# Patient Record
Sex: Female | Born: 1965 | Race: Black or African American | Hispanic: No | Marital: Single | Smoking: Never smoker
Health system: Southern US, Community
[De-identification: ages and names within clinical notes are randomized; demographics above are authoritative.]

## PROBLEM LIST (undated history)

## (undated) DIAGNOSIS — G4733 Obstructive sleep apnea (adult) (pediatric): Secondary | ICD-10-CM

## (undated) DIAGNOSIS — R12 Heartburn: Secondary | ICD-10-CM

## (undated) DIAGNOSIS — M76829 Posterior tibial tendinitis, unspecified leg: Secondary | ICD-10-CM

## (undated) DIAGNOSIS — E669 Obesity, unspecified: Secondary | ICD-10-CM

## (undated) DIAGNOSIS — D34 Benign neoplasm of thyroid gland: Secondary | ICD-10-CM

## (undated) DIAGNOSIS — M1990 Unspecified osteoarthritis, unspecified site: Secondary | ICD-10-CM

## (undated) DIAGNOSIS — Z833 Family history of diabetes mellitus: Secondary | ICD-10-CM

## (undated) DIAGNOSIS — F32A Depression, unspecified: Secondary | ICD-10-CM

## (undated) DIAGNOSIS — F329 Major depressive disorder, single episode, unspecified: Secondary | ICD-10-CM

## (undated) DIAGNOSIS — M79671 Pain in right foot: Secondary | ICD-10-CM

## (undated) DIAGNOSIS — K219 Gastro-esophageal reflux disease without esophagitis: Secondary | ICD-10-CM

## (undated) HISTORY — DX: Major depressive disorder, single episode, unspecified: F32.9

## (undated) HISTORY — DX: Posterior tibial tendinitis, unspecified leg: M76.829

## (undated) HISTORY — DX: Unspecified osteoarthritis, unspecified site: M19.90

## (undated) HISTORY — DX: Benign neoplasm of thyroid gland: D34

## (undated) HISTORY — PX: TONSILLECTOMY: SUR1361

## (undated) HISTORY — DX: Heartburn: R12

## (undated) HISTORY — DX: Depression, unspecified: F32.A

## (undated) HISTORY — DX: Obesity, unspecified: E66.9

## (undated) HISTORY — DX: Obstructive sleep apnea (adult) (pediatric): G47.33

## (undated) HISTORY — DX: Family history of diabetes mellitus: Z83.3

## (undated) HISTORY — DX: Pain in right foot: M79.671

## (undated) HISTORY — DX: Gastro-esophageal reflux disease without esophagitis: K21.9

## (undated) HISTORY — PX: APPENDECTOMY: SHX54

## (undated) HISTORY — PX: TUBAL LIGATION: SHX77

---

## 1999-09-12 ENCOUNTER — Other Ambulatory Visit: Admission: RE | Admit: 1999-09-12 | Discharge: 1999-09-12 | Payer: Self-pay | Admitting: Obstetrics and Gynecology

## 1999-09-25 ENCOUNTER — Encounter (INDEPENDENT_AMBULATORY_CARE_PROVIDER_SITE_OTHER): Payer: Self-pay | Admitting: Specialist

## 1999-09-25 ENCOUNTER — Ambulatory Visit (HOSPITAL_COMMUNITY): Admission: RE | Admit: 1999-09-25 | Discharge: 1999-09-25 | Payer: Self-pay | Admitting: Obstetrics and Gynecology

## 2000-10-01 ENCOUNTER — Inpatient Hospital Stay (HOSPITAL_COMMUNITY): Admission: AD | Admit: 2000-10-01 | Discharge: 2000-10-01 | Payer: Self-pay | Admitting: Obstetrics and Gynecology

## 2000-11-24 ENCOUNTER — Inpatient Hospital Stay (HOSPITAL_COMMUNITY): Admission: AD | Admit: 2000-11-24 | Discharge: 2000-11-24 | Payer: Self-pay | Admitting: Obstetrics and Gynecology

## 2000-12-11 ENCOUNTER — Other Ambulatory Visit: Admission: RE | Admit: 2000-12-11 | Discharge: 2000-12-11 | Payer: Self-pay | Admitting: Obstetrics and Gynecology

## 2001-02-12 ENCOUNTER — Inpatient Hospital Stay (HOSPITAL_COMMUNITY): Admission: AD | Admit: 2001-02-12 | Discharge: 2001-02-12 | Payer: Self-pay | Admitting: Obstetrics & Gynecology

## 2001-03-26 ENCOUNTER — Other Ambulatory Visit: Admission: RE | Admit: 2001-03-26 | Discharge: 2001-03-26 | Payer: Self-pay | Admitting: Obstetrics and Gynecology

## 2001-10-26 ENCOUNTER — Encounter: Payer: Self-pay | Admitting: Obstetrics and Gynecology

## 2001-10-26 ENCOUNTER — Ambulatory Visit (HOSPITAL_COMMUNITY): Admission: RE | Admit: 2001-10-26 | Discharge: 2001-10-26 | Payer: Self-pay | Admitting: Obstetrics and Gynecology

## 2002-02-26 ENCOUNTER — Emergency Department (HOSPITAL_COMMUNITY): Admission: EM | Admit: 2002-02-26 | Discharge: 2002-02-26 | Payer: Self-pay | Admitting: *Deleted

## 2002-04-07 ENCOUNTER — Encounter: Payer: Self-pay | Admitting: Internal Medicine

## 2002-04-07 ENCOUNTER — Inpatient Hospital Stay (HOSPITAL_COMMUNITY): Admission: EM | Admit: 2002-04-07 | Discharge: 2002-04-09 | Payer: Self-pay | Admitting: Emergency Medicine

## 2002-04-08 ENCOUNTER — Encounter: Payer: Self-pay | Admitting: Family Medicine

## 2002-05-06 ENCOUNTER — Other Ambulatory Visit: Admission: RE | Admit: 2002-05-06 | Discharge: 2002-05-06 | Payer: Self-pay | Admitting: Obstetrics and Gynecology

## 2002-06-24 ENCOUNTER — Other Ambulatory Visit: Admission: RE | Admit: 2002-06-24 | Discharge: 2002-06-24 | Payer: Self-pay | Admitting: Obstetrics and Gynecology

## 2003-07-12 ENCOUNTER — Other Ambulatory Visit: Admission: RE | Admit: 2003-07-12 | Discharge: 2003-07-12 | Payer: Self-pay | Admitting: Obstetrics and Gynecology

## 2003-07-18 ENCOUNTER — Emergency Department (HOSPITAL_COMMUNITY): Admission: EM | Admit: 2003-07-18 | Discharge: 2003-07-18 | Payer: Self-pay | Admitting: Emergency Medicine

## 2004-09-11 ENCOUNTER — Ambulatory Visit (HOSPITAL_COMMUNITY): Admission: RE | Admit: 2004-09-11 | Discharge: 2004-09-11 | Payer: Self-pay | Admitting: General Surgery

## 2004-09-11 ENCOUNTER — Ambulatory Visit: Payer: Self-pay | Admitting: Family Medicine

## 2004-12-24 ENCOUNTER — Ambulatory Visit: Payer: Self-pay | Admitting: Family Medicine

## 2005-01-07 ENCOUNTER — Ambulatory Visit (HOSPITAL_COMMUNITY): Admission: RE | Admit: 2005-01-07 | Discharge: 2005-01-07 | Payer: Self-pay | Admitting: Family Medicine

## 2005-01-07 ENCOUNTER — Ambulatory Visit: Payer: Self-pay | Admitting: Family Medicine

## 2005-03-06 ENCOUNTER — Ambulatory Visit: Payer: Self-pay | Admitting: Internal Medicine

## 2005-03-12 ENCOUNTER — Ambulatory Visit (HOSPITAL_COMMUNITY): Admission: RE | Admit: 2005-03-12 | Discharge: 2005-03-12 | Payer: Self-pay | Admitting: Internal Medicine

## 2005-03-12 ENCOUNTER — Ambulatory Visit: Payer: Self-pay | Admitting: Internal Medicine

## 2005-03-15 ENCOUNTER — Encounter (HOSPITAL_COMMUNITY): Admission: RE | Admit: 2005-03-15 | Discharge: 2005-04-14 | Payer: Self-pay | Admitting: Internal Medicine

## 2005-04-09 ENCOUNTER — Other Ambulatory Visit: Admission: RE | Admit: 2005-04-09 | Discharge: 2005-04-09 | Payer: Self-pay | Admitting: Obstetrics and Gynecology

## 2005-05-09 ENCOUNTER — Inpatient Hospital Stay (HOSPITAL_COMMUNITY): Admission: EM | Admit: 2005-05-09 | Discharge: 2005-05-13 | Payer: Self-pay | Admitting: Emergency Medicine

## 2005-11-05 ENCOUNTER — Encounter: Admission: RE | Admit: 2005-11-05 | Discharge: 2005-11-05 | Payer: Self-pay | Admitting: Obstetrics and Gynecology

## 2005-11-28 ENCOUNTER — Ambulatory Visit: Payer: Self-pay | Admitting: Family Medicine

## 2005-12-18 ENCOUNTER — Ambulatory Visit: Payer: Self-pay | Admitting: Family Medicine

## 2005-12-23 ENCOUNTER — Ambulatory Visit (HOSPITAL_COMMUNITY): Admission: RE | Admit: 2005-12-23 | Discharge: 2005-12-23 | Payer: Self-pay | Admitting: Family Medicine

## 2007-01-16 ENCOUNTER — Encounter: Admission: RE | Admit: 2007-01-16 | Discharge: 2007-01-16 | Payer: Self-pay | Admitting: Obstetrics and Gynecology

## 2007-10-15 ENCOUNTER — Ambulatory Visit: Payer: Self-pay | Admitting: Orthopedic Surgery

## 2007-10-15 DIAGNOSIS — M76829 Posterior tibial tendinitis, unspecified leg: Secondary | ICD-10-CM | POA: Insufficient documentation

## 2007-12-17 ENCOUNTER — Ambulatory Visit: Payer: Self-pay | Admitting: Orthopedic Surgery

## 2007-12-17 ENCOUNTER — Ambulatory Visit: Payer: Self-pay | Admitting: Family Medicine

## 2007-12-17 LAB — CONVERTED CEMR LAB
Basophils Relative: 1 % (ref 0–1)
Calcium: 9.1 mg/dL (ref 8.4–10.5)
Eosinophils Relative: 2 % (ref 0–5)
Glucose, Bld: 97 mg/dL (ref 70–99)
HDL: 65 mg/dL (ref >39)
Lymphocytes Relative: 37 % (ref 12–46)
MCHC: 30.4 g/dL (ref 30.0–36.0)
MCV: 81.8 fL (ref 78.0–100.0)
Neutro Abs: 3.1 10*3/uL (ref 1.7–7.7)
Neutrophils Relative %: 53 % (ref 43–77)
Potassium: 5 meq/L (ref 3.5–5.3)
RBC: 4.34 M/uL (ref 3.87–5.11)
RDW: 16.3 % — ABNORMAL HIGH (ref 11.5–15.5)
TSH: 0.928 microintl units/mL (ref 0.350–5.50)
Total CHOL/HDL Ratio: 2.6
VLDL: 12 mg/dL (ref 0–40)
WBC: 5.8 10*3/uL (ref 4.0–10.5)

## 2007-12-18 ENCOUNTER — Encounter: Payer: Self-pay | Admitting: Family Medicine

## 2007-12-18 LAB — CONVERTED CEMR LAB: Retic Ct Pct: 1.7 % (ref 0.4–3.1)

## 2008-03-14 ENCOUNTER — Ambulatory Visit: Payer: Self-pay | Admitting: Family Medicine

## 2008-03-17 ENCOUNTER — Ambulatory Visit (HOSPITAL_COMMUNITY): Admission: RE | Admit: 2008-03-17 | Discharge: 2008-03-17 | Payer: Self-pay | Admitting: Family Medicine

## 2008-03-17 DIAGNOSIS — E669 Obesity, unspecified: Secondary | ICD-10-CM | POA: Insufficient documentation

## 2008-03-17 DIAGNOSIS — K219 Gastro-esophageal reflux disease without esophagitis: Secondary | ICD-10-CM | POA: Insufficient documentation

## 2008-03-17 DIAGNOSIS — J45909 Unspecified asthma, uncomplicated: Secondary | ICD-10-CM | POA: Insufficient documentation

## 2008-03-17 DIAGNOSIS — M79609 Pain in unspecified limb: Secondary | ICD-10-CM | POA: Insufficient documentation

## 2008-03-23 ENCOUNTER — Ambulatory Visit (HOSPITAL_COMMUNITY): Admission: RE | Admit: 2008-03-23 | Discharge: 2008-03-23 | Payer: Self-pay | Admitting: Family Medicine

## 2008-04-07 ENCOUNTER — Encounter (INDEPENDENT_AMBULATORY_CARE_PROVIDER_SITE_OTHER): Payer: Self-pay | Admitting: Interventional Radiology

## 2008-04-07 ENCOUNTER — Other Ambulatory Visit: Admission: RE | Admit: 2008-04-07 | Discharge: 2008-04-07 | Payer: Self-pay | Admitting: Interventional Radiology

## 2008-04-07 ENCOUNTER — Encounter: Admission: RE | Admit: 2008-04-07 | Discharge: 2008-04-07 | Payer: Self-pay | Admitting: Otolaryngology

## 2008-06-11 HISTORY — PX: OTHER SURGICAL HISTORY: SHX169

## 2008-06-15 ENCOUNTER — Encounter: Payer: Self-pay | Admitting: Family Medicine

## 2008-06-15 ENCOUNTER — Ambulatory Visit: Payer: Self-pay | Admitting: Family Medicine

## 2008-06-15 LAB — CONVERTED CEMR LAB: Retic Ct Pct: 1.5 % (ref 0.4–3.1)

## 2008-06-22 ENCOUNTER — Ambulatory Visit (HOSPITAL_COMMUNITY): Admission: RE | Admit: 2008-06-22 | Discharge: 2008-06-22 | Payer: Self-pay | Admitting: Family Medicine

## 2008-07-06 ENCOUNTER — Ambulatory Visit (HOSPITAL_COMMUNITY): Admission: RE | Admit: 2008-07-06 | Discharge: 2008-07-07 | Payer: Self-pay | Admitting: Otolaryngology

## 2008-07-06 ENCOUNTER — Encounter (INDEPENDENT_AMBULATORY_CARE_PROVIDER_SITE_OTHER): Payer: Self-pay | Admitting: Otolaryngology

## 2008-07-06 ENCOUNTER — Encounter: Payer: Self-pay | Admitting: Family Medicine

## 2008-11-01 ENCOUNTER — Encounter: Payer: Self-pay | Admitting: Family Medicine

## 2009-05-02 ENCOUNTER — Ambulatory Visit: Payer: Self-pay | Admitting: Family Medicine

## 2009-05-02 DIAGNOSIS — R21 Rash and other nonspecific skin eruption: Secondary | ICD-10-CM | POA: Insufficient documentation

## 2009-05-02 LAB — CONVERTED CEMR LAB
Cholesterol, target level: 200 mg/dL
HDL goal, serum: 40 mg/dL

## 2009-05-03 ENCOUNTER — Encounter: Payer: Self-pay | Admitting: Family Medicine

## 2009-05-08 LAB — CONVERTED CEMR LAB: Retic Ct Pct: 1.8 % (ref 0.4–3.1)

## 2009-06-14 ENCOUNTER — Encounter: Payer: Self-pay | Admitting: Family Medicine

## 2009-06-23 ENCOUNTER — Encounter: Payer: Self-pay | Admitting: Family Medicine

## 2009-06-24 ENCOUNTER — Encounter: Payer: Self-pay | Admitting: Family Medicine

## 2009-07-03 ENCOUNTER — Ambulatory Visit: Payer: Self-pay | Admitting: Family Medicine

## 2009-08-14 ENCOUNTER — Emergency Department (HOSPITAL_COMMUNITY): Admission: EM | Admit: 2009-08-14 | Discharge: 2009-08-14 | Payer: Self-pay | Admitting: Emergency Medicine

## 2009-08-15 ENCOUNTER — Ambulatory Visit: Payer: Self-pay | Admitting: Family Medicine

## 2009-08-15 DIAGNOSIS — R51 Headache: Secondary | ICD-10-CM | POA: Insufficient documentation

## 2009-08-15 DIAGNOSIS — R519 Headache, unspecified: Secondary | ICD-10-CM | POA: Insufficient documentation

## 2009-08-17 ENCOUNTER — Encounter: Payer: Self-pay | Admitting: Family Medicine

## 2009-08-18 ENCOUNTER — Telehealth: Payer: Self-pay | Admitting: Family Medicine

## 2009-08-18 ENCOUNTER — Encounter: Payer: Self-pay | Admitting: Family Medicine

## 2009-08-24 ENCOUNTER — Encounter: Payer: Self-pay | Admitting: Family Medicine

## 2009-09-26 ENCOUNTER — Encounter: Payer: Self-pay | Admitting: Family Medicine

## 2009-11-16 ENCOUNTER — Encounter (INDEPENDENT_AMBULATORY_CARE_PROVIDER_SITE_OTHER): Payer: Self-pay | Admitting: *Deleted

## 2009-11-28 ENCOUNTER — Encounter: Payer: Self-pay | Admitting: Family Medicine

## 2009-12-13 ENCOUNTER — Encounter: Payer: Self-pay | Admitting: Family Medicine

## 2009-12-20 ENCOUNTER — Ambulatory Visit (HOSPITAL_COMMUNITY): Admission: RE | Admit: 2009-12-20 | Discharge: 2009-12-20 | Payer: Self-pay | Admitting: Obstetrics and Gynecology

## 2010-02-14 ENCOUNTER — Telehealth: Payer: Self-pay | Admitting: Family Medicine

## 2010-12-02 ENCOUNTER — Encounter: Payer: Self-pay | Admitting: Family Medicine

## 2010-12-02 ENCOUNTER — Encounter: Payer: Self-pay | Admitting: Otolaryngology

## 2010-12-02 ENCOUNTER — Encounter: Payer: Self-pay | Admitting: Obstetrics and Gynecology

## 2010-12-11 NOTE — Progress Notes (Signed)
Summary: [facility]  [facility]   Imported By: Lind Guest 12/19/2009 11:37:12  _____________________________________________________________________  External Attachment:    Type:   Image     Comment:   External Document

## 2010-12-11 NOTE — Progress Notes (Signed)
Summary: [facility]  [facility]   Imported By: Lind Guest 01/23/2010 10:16:28  _____________________________________________________________________  External Attachment:    Type:   Image     Comment:   External Document

## 2010-12-11 NOTE — Progress Notes (Signed)
  Phone Note From Pharmacy   Caller: [facility]* Summary of Call: omepra/bicar 40/1100 requires pa Initial call taken by: Adella Hare LPN,  February 14, 2010 2:59 PM  Follow-up for Phone Call        pls ask pharm to send the preferred drug so we can offer pt that choice instead first Follow-up by: Syliva Overman MD,  February 15, 2010 5:07 AM  Additional Follow-up for Phone Call Additional follow up Details #1::        omeprazole 40 or pantoprazole 40 Additional Follow-up by: Adella Hare LPN,  February 15, 2010 10:36 AM    Additional Follow-up for Phone Call Additional follow up Details #2::    med changed to pantoprazole per dr Lodema Hong  called patient, left message Follow-up by: Adella Hare LPN,  February 15, 2010 2:23 PM  Additional Follow-up for Phone Call Additional follow up Details #3:: Details for Additional Follow-up Action Taken: called patient, left message Additional Follow-up by: Adella Hare LPN,  February 16, 2010 2:08 PM  New/Updated Medications: PANTOPRAZOLE SODIUM 40 MG TBEC (PANTOPRAZOLE SODIUM) one tab by mouth once daily PANTOPRAZOLE SODIUM 40 MG TBEC (PANTOPRAZOLE SODIUM) one tab by mouth once daily  discontinue zegerid Prescriptions: PANTOPRAZOLE SODIUM 40 MG TBEC (PANTOPRAZOLE SODIUM) one tab by mouth once daily  discontinue zegerid  #30 x 2   Entered by:   Adella Hare LPN   Authorized by:   Syliva Overman MD   Signed by:   Adella Hare LPN on 16/08/9603   Method used:   Electronically to        [facility]* (retail)       [address] [place], [place]  [address]       Ph: [phone]       Fax: [phone]   RxID:   [id number] PANTOPRAZOLE SODIUM 40 MG TBEC (PANTOPRAZOLE SODIUM) one tab by mouth once daily  #30 x 2   Entered by:   Adella Hare LPN   Authorized by:   Syliva Overman MD   Signed by:   Adella Hare LPN on 09/07/2535   Method used:   Electronically to        [facility]  (retail)       [address] [place], [place]  [address]       Ph: [phone]       Fax: [phone]   RxID:   [id number]

## 2010-12-11 NOTE — Letter (Signed)
Summary: 1st Missed Appt.  [facility]  [address]   [place], [place] [address]   Phone: [phone]  Fax: [phone]    November 16, 2009  MRN: [id number]  Angie Powell [address] [place], [place]  [address]  Dear [name],  At [facility], we make every attempt to fit patients into our schedule by reserving several appointment slots for same-day appointments.  However, we cannot always make appointments for patients the same day they are calling.  At the end of the day, we look back at our schedule and find that because of last-minute cancellations and patients not showing up for their scheduled appointments, we have several appointment slots that are left open and could have been used by another person who really needed it.  In the past, you may have been one of the patients who could not get in when you needed to.  But recently, you were one of the patients with an appointment that you didn't show up for or canceled too late for [place] to fill it.  We choose not to charge no-show or last minute cancellation fees to our patients, like many other offices do.  We do not wish to institute that policy and hope we never have to.  However, we kindly request that you assist [place] by providing at least 24 hours' notice if you can't make your appointment.  If no-shows or late cancellations become habitual (i.e. Three or more in a one-year period), we may terminate the physician-patient relationship.    Thank you for your consideration and cooperation.   Altamease Oiler

## 2011-01-28 ENCOUNTER — Encounter: Payer: Self-pay | Admitting: Family Medicine

## 2011-01-28 ENCOUNTER — Telehealth (INDEPENDENT_AMBULATORY_CARE_PROVIDER_SITE_OTHER): Payer: Self-pay | Admitting: *Deleted

## 2011-01-30 ENCOUNTER — Ambulatory Visit: Payer: Self-pay | Admitting: Family Medicine

## 2011-01-31 LAB — CBC
HCT: 36.3 % (ref 36.0–46.0)
Hemoglobin: 11.9 g/dL — ABNORMAL LOW (ref 12.0–15.0)
MCV: 87.3 fL (ref 78.0–100.0)
Platelets: 305 10*3/uL (ref 150–400)
RBC: 4.16 MIL/uL (ref 3.87–5.11)

## 2011-01-31 LAB — PREGNANCY, URINE: Preg Test, Ur: NEGATIVE

## 2011-02-07 NOTE — Progress Notes (Signed)
Summary: appointment  Phone Note Call from Patient   Summary of Call: patient was rescheduled from Wednesday the 21st of March.  I rescheduled her to March 07, 2011 if something comes up open before then please call her at [phone]. She would like a Monday. Initial call taken by: Curtis Sites,  January 28, 2011 5:23 PM

## 2011-03-05 ENCOUNTER — Encounter: Payer: Self-pay | Admitting: Family Medicine

## 2011-03-06 ENCOUNTER — Encounter: Payer: Self-pay | Admitting: Family Medicine

## 2011-03-07 ENCOUNTER — Encounter: Payer: Self-pay | Admitting: Family Medicine

## 2011-03-07 ENCOUNTER — Ambulatory Visit (INDEPENDENT_AMBULATORY_CARE_PROVIDER_SITE_OTHER): Payer: 59 | Admitting: Family Medicine

## 2011-03-07 VITALS — BP 118/80 | HR 103 | Resp 16 | Ht 66.0 in | Wt 224.1 lb

## 2011-03-07 DIAGNOSIS — E041 Nontoxic single thyroid nodule: Secondary | ICD-10-CM

## 2011-03-07 DIAGNOSIS — R5383 Other fatigue: Secondary | ICD-10-CM

## 2011-03-07 DIAGNOSIS — F32A Depression, unspecified: Secondary | ICD-10-CM

## 2011-03-07 DIAGNOSIS — R7301 Impaired fasting glucose: Secondary | ICD-10-CM

## 2011-03-07 DIAGNOSIS — K219 Gastro-esophageal reflux disease without esophagitis: Secondary | ICD-10-CM

## 2011-03-07 DIAGNOSIS — J309 Allergic rhinitis, unspecified: Secondary | ICD-10-CM

## 2011-03-07 DIAGNOSIS — R5381 Other malaise: Secondary | ICD-10-CM

## 2011-03-07 DIAGNOSIS — E669 Obesity, unspecified: Secondary | ICD-10-CM

## 2011-03-07 DIAGNOSIS — Z13220 Encounter for screening for lipoid disorders: Secondary | ICD-10-CM

## 2011-03-07 DIAGNOSIS — F329 Major depressive disorder, single episode, unspecified: Secondary | ICD-10-CM

## 2011-03-07 DIAGNOSIS — E559 Vitamin D deficiency, unspecified: Secondary | ICD-10-CM

## 2011-03-07 MED ORDER — PAROXETINE HCL 10 MG PO TABS: 10.0000 mg | ORAL_TABLET | Freq: Every day | ORAL | Status: DC

## 2011-03-07 NOTE — Patient Instructions (Addendum)
F/u in 7 weeks.  It is important that you exercise regularly at least 30 minutes 5 times a week. If you develop chest pain, have severe difficulty breathing, or feel very tired, stop exercising immediately and seek medical attention  A healthy diet is rich in fruit, vegetables and whole grains. Poultry fish, nuts and beans are a healthy choice for protein rather then red meat. A low sodium diet and drinking 64 ounces of water daily is generally recommended. Oils and sweet should be limited. Carbohydrates especially for those who are diabetic or overweight, should be limited to 34-45 gram per meal. It is important to eat on a regular schedule, at least 3 times daily. Snacks should be primarily fruits, vegetables or nuts.  CBC , chem 7, lipid, TSH , HBA1C and Vit D asap fasting  New med for depression, and pls resume counselling.   You are referred for a thyroid ultrasound

## 2011-03-08 LAB — CBC WITH DIFFERENTIAL/PLATELET
Basophils Relative: 1 % (ref 0–1)
Eosinophils Absolute: 0.1 10*3/uL (ref 0.0–0.7)
Eosinophils Relative: 2 % (ref 0–5)
Hemoglobin: 12.8 g/dL (ref 12.0–15.0)
Lymphs Abs: 2.1 10*3/uL (ref 0.7–4.0)
MCH: 28.6 pg (ref 26.0–34.0)
MCHC: 32.1 g/dL (ref 30.0–36.0)
MCV: 89.1 fL (ref 78.0–100.0)
Monocytes Relative: 6 % (ref 3–12)
Neutrophils Relative %: 63 % (ref 43–77)
RBC: 4.48 MIL/uL (ref 3.87–5.11)

## 2011-03-08 LAB — LIPID PANEL
Cholesterol: 171 mg/dL (ref 0–200)
LDL Cholesterol: 103 mg/dL — ABNORMAL HIGH (ref 0–99)
Total CHOL/HDL Ratio: 3.2 Ratio
VLDL: 15 mg/dL (ref 0–40)

## 2011-03-08 LAB — BASIC METABOLIC PANEL
BUN: 10 mg/dL (ref 6–23)
CO2: 26 mEq/L (ref 19–32)
Chloride: 103 mEq/L (ref 96–112)
Creat: 0.79 mg/dL (ref 0.40–1.20)
Potassium: 4.5 mEq/L (ref 3.5–5.3)

## 2011-03-08 LAB — TSH: TSH: 1.003 u[IU]/mL (ref 0.350–4.500)

## 2011-03-09 LAB — HEMOGLOBIN A1C
Hgb A1c MFr Bld: 5.5 % (ref <5.7)
Mean Plasma Glucose: 111 mg/dL (ref <117)

## 2011-03-11 ENCOUNTER — Telehealth: Payer: Self-pay | Admitting: Family Medicine

## 2011-03-11 MED ORDER — VITAMIN D (ERGOCALCIFEROL) 1.25 MG (50000 UNIT) PO CAPS: 50000.0000 [IU] | ORAL_CAPSULE | ORAL | Status: DC

## 2011-03-11 NOTE — Telephone Encounter (Signed)
Please see previous message regarding same issue

## 2011-03-12 ENCOUNTER — Other Ambulatory Visit: Payer: Self-pay | Admitting: Family Medicine

## 2011-03-12 DIAGNOSIS — E041 Nontoxic single thyroid nodule: Secondary | ICD-10-CM

## 2011-03-14 ENCOUNTER — Ambulatory Visit (HOSPITAL_COMMUNITY)
Admission: RE | Admit: 2011-03-14 | Discharge: 2011-03-14 | Disposition: A | Discharge status: Home / Self Care | Payer: 59 | Source: Ambulatory Visit | Attending: Family Medicine | Admitting: Family Medicine

## 2011-03-14 DIAGNOSIS — E041 Nontoxic single thyroid nodule: Secondary | ICD-10-CM | POA: Insufficient documentation

## 2011-03-14 DIAGNOSIS — E042 Nontoxic multinodular goiter: Secondary | ICD-10-CM | POA: Insufficient documentation

## 2011-03-14 DIAGNOSIS — J309 Allergic rhinitis, unspecified: Secondary | ICD-10-CM | POA: Insufficient documentation

## 2011-03-14 NOTE — Assessment & Plan Note (Signed)
New diagnosis, pt allowed and encouraged to ventilate. She is started on medication, though I strongly recommend therapy, she does not wish to pursue at this time. 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

## 2011-03-14 NOTE — Progress Notes (Signed)
  Subjective:    Patient ID: Angie Powell, female    DOB: 1966-09-10, 45 y.o.   MRN: [id number]  HPI Pt in for f/u, she was last seen approx 2 years ago. She has become separated from he spouse of over 12 yrs in the past 3 months, this has been emotionally very stressful, and she is here for assistance with med management. Had  therapy through her job last year, does not wish to pursue this at this time.She is not suicidal or homicidal at this time, and though challenged on her job, she is still able to work effectively.She does report mild anxiety and insomnia. She takes med for allergies and reflux regularly. She has had ablation with reduced menstrual flow. She has not been succesful in weight loss, she has been emotionally challenged and unable to focus on this.     Review of Systems Denies recent fever or chills. Denies sinus pressure, nasal congestion, ear pain or sore throat. Denies chest congestion, productive cough or wheezing. Denies chest pains, palpitations, paroxysmal nocturnal dyspnea, orthopnea and leg swelling Denies abdominal pain, nausea, vomiting,diarrhea or constipation.  Denies rectal bleeding or change in bowel movement. Denies dysuria, frequency, hesitancy or incontinence. Denies joint pain, swelling and limitation in mobility. Denies headaches, seizure, numbness, or tingling. . Denies skin break down or rash.        Objective:   Physical Exam Patient alert and oriented and in no Cardiopulmonary distress.  HEENT: No facial asymmetry, EOMI, no sinus tenderness, TM's clear, Oropharynx pink and moist.  Neck supple no adenopathy. Possible thyroid nodule Chest: Clear to auscultation bilaterally.  CVS: S1, S2 no murmurs, no S3.  ABD: Soft non tender. Bowel sounds normal.  Ext: No edema  MS: Adequate ROM spine, shoulders, hips and knees.  Skin: Intact, no ulcerations or rash noted.  Psych: Good eye contact, normal affect. Memory intact depressed appearing  at times  CNS: CN 2-12 intact, power, tone and sensation normal throughout.        Assessment & Plan:

## 2011-03-14 NOTE — Assessment & Plan Note (Signed)
Deteriorated. Patient re-educated about  the importance of commitment to a  minimum of 150 minutes of exercise per week. The importance of healthy food choices with portion control discussed. Encouraged to start a food diary, count calories and to consider  joining a support group. Sample diet sheets offered. Goals set by the patient for the next several months.She is to start medication also

## 2011-03-14 NOTE — Assessment & Plan Note (Signed)
Pt has had partial thyroidectomy, her mother had thyroid cancer, rept [place] to eval remaining gland and TSH

## 2011-03-14 NOTE — Assessment & Plan Note (Signed)
Unchanged, maintained on med 

## 2011-03-14 NOTE — Assessment & Plan Note (Signed)
Unchanged, maintained on med

## 2011-03-15 ENCOUNTER — Telehealth: Payer: Self-pay | Admitting: Family Medicine

## 2011-03-15 DIAGNOSIS — E041 Nontoxic single thyroid nodule: Secondary | ICD-10-CM

## 2011-03-15 NOTE — Telephone Encounter (Signed)
Pt to be referred to ENT

## 2011-03-26 NOTE — Op Note (Signed)
NAME:  Angie Powell, Angie Powell               ACCOUNT NO.:  [id number]   MEDICAL RECORD NO.:  [id number]          PATIENT TYPE:  OIB   LOCATION:  5120                         FACILITY:  [facility]   PHYSICIAN:  Jefry H. Pollyann Kennedy, MD     DATE OF BIRTH:  06-19-1966   DATE OF PROCEDURE:  07/06/2008  DATE OF DISCHARGE:                               OPERATIVE REPORT   PREOPERATIVE DIAGNOSIS:  Right thyroid mass.   POSTOPERATIVE DIAGNOSIS:  Right thyroid mass.   PROCEDURE:  Right hemithyroidectomy.   SURGEON:  Jefry H. Pollyann Kennedy, MD   ASSISTANT:  Kinnie Scales. Annalee Genta, M.D.   ANESTHESIA:  General endotracheal anesthesia was used.   COMPLICATIONS:  No complications.   ESTIMATED BLOOD LOSS:  Minimal.   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   REFERRING PHYSICIAN:  Dr. Lodema Hong.   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  Risks, benefits, alternatives, and complications of the  procedure were explained to the patient, she seemed to understand and  agreed to the surgery.   OPERATIVE PROCEDURE:  The patient was taken to the operating room and  placed on the operating table in the supine position.  Following  induction of general endotracheal anesthesia, the neck was prepped and  draped in the standard fashion.  A low-collar incision was outlined in a  cervical crease using a marking pen and electrocautery was used to  incise the skin and subcutaneous tissue down through the platysmal  layer.  A subplatysmal flap was developed superiorly to the thyroid  notch and inferiorly to the clavicle.  The thyroid retractor was used  throughout the case.  Midline fascia was divided using electrocautery.  The strap muscles were reflected off of the right lobe of the thyroid.  The thyroid was  retracted immediately with Allis forceps and dissection  was then accomplished.  Dissection started in the superior pole  carefully identifying, dissecting, ligating, and dividing all the  vascular structures staying right on the capsule of the gland.  The  dissection then continued to the middle thyroid vein, which was ligated  and divided as well.  A 4-0 silk suture was used throughout the case.  The inferior vasculature was similarly ligated and divided.  The  ligament at the area was divided.  Thyroid was dissected off the face of  the trachea.  The recurrent nerve was identified and preserved.  The  isthmus was divided using electrocautery.  This specimen sent for frozen  section evaluation.  Bipolar cautery was used for completion of  hemostasis.  The wound was closed in layers using 4-0 chromic on the  midline fascia and the subcutaneous layer as well as the platysmal  layer.  A 7-French round JP drain was left in the wound, exited through  the left-sided incision, secured in place with a nylon suture.  Dermabond was used on the skin.  The patient was  then awakened,  extubated, and transfer to recovery room in stable condition.      Jefry H. Pollyann Kennedy, MD  Electronically Signed     JHR/MEDQ  D:  07/06/2008  T:  07/07/2008  Job:  [id number]   cc:   Dr. Lodema Hong

## 2011-03-29 NOTE — H&P (Signed)
NAME:  Angie Powell, Angie Powell               ACCOUNT NO.:  [id number]   MEDICAL RECORD NO.:  [id number]          PATIENT TYPE:  INP   LOCATION:  A303                          FACILITY:  [facility]   PHYSICIAN:  Dirk Dress. Katrinka Blazing, M.D.   DATE OF BIRTH:  May 05, 1966   DATE OF ADMISSION:  05/09/2005  DATE OF DISCHARGE:  LH                                HISTORY & PHYSICAL   HISTORY:  This is a 45 year old female with history of recurrent severe  abdominal pain of one-day duration.  She had nausea without vomiting.  The  pain was in her epigastrium.  It had awakened her during the night. She was  seen in the emergency room where she was in uncontrolled pain.  She states  that this was the pain similar to the pain that she had over the past two  months.  She had been evaluated by Dr. Jena Gauss for this with no abnormal  findings.  She was felt to have refractory GERD.  She, however, was on  Zegerid daily without improvement.  The patient had a CT of the abdomen done  which suggested that she may have a partial proximal small bowel obstruction  and she is admitted for treatment.   PAST HISTORY:  She has a history of osteoarthritis, gastroesophageal reflux  disease, asthma.   CURRENT MEDICATIONS:  1.  Zegerid 40 mg daily.  2.  Albuterol p.r.n.  3.  Combivent p.r.n.   ALLERGIES:  No known drug allergies.   FAMILY HISTORY:  Positive for diabetes mellitus.   SOCIAL HISTORY:  She is married, employed.  She does not drink, smoke or use  drugs.   REVIEW OF SYSTEMS:  Unremarkable except for her GI symptoms.   PAST SURGICAL HISTORY:  D&C and hysteroscopy in 2004.   PHYSICAL EXAMINATION:  GENERAL:  She is a pleasant female who is in some  acute distress, which appears to be more related to her nasogastric tube.  She has some upper abdominal pain with crampy discomfort.  VITAL SIGNS:  Blood pressure 142/75, pulse 74, respirations 20, temperature  97.  HEENT:  Unremarkable.  Neck is supple.  No JVD, bruit,  adenopathy or  thyromegaly.  CHEST:  Clear to auscultation.  HEART:  Regular rate and rhythm without murmur, gallop or rub.  ABDOMEN:  Distended.  Moderate tenderness in the epigastrium and left upper  quadrant.  Active bowel sounds.  No lower abdominal tenderness.  EXTREMITIES:  No cyanosis, clubbing or edema.  NEUROLOGIC:  No focal, motor, sensory or cerebellar deficit.   IMPRESSION:  Acute abdominal pain with radiographic evidence suggesting  partial small bowel obstruction; however, clinical signs to me suggest  adynamic ileus and/or constipation.   PLAN:  The patient will be admitted.  She will have nasogastric  decompression.  She will be made n.p.o.  We will give her some enemas and  probably she will need to have a small bowel followthrough.  Based on the  results of these tasks, we will then make a decision as to what other  treatment she will need.  LCS/MEDQ  D:  05/13/2005  T:  05/13/2005  Job:  [id number]   cc:   Lodema Hong, MD

## 2011-03-29 NOTE — Discharge Summary (Signed)
NAME:  CASSEY, BACIGALUPO               ACCOUNT NO.:  [id number]   MEDICAL RECORD NO.:  [id number]          PATIENT TYPE:  INP   LOCATION:  A303                          FACILITY:  [facility]   PHYSICIAN:  Dirk Dress. Katrinka Blazing, M.D.   DATE OF BIRTH:  1966/05/22   DATE OF ADMISSION:  05/09/2005  DATE OF DISCHARGE:  07/03/2006LH                                 DISCHARGE SUMMARY   DISCHARGE DIAGNOSES:  1.  Partial small bowel obstruction treated, improved.  2.  Gastroesophageal reflux disease.  3.  Osteoarthritis.  4.  Asthma.   DISPOSITION:  The patient discharged home in stable satisfactory condition.   DISCHARGE MEDICATIONS:  1.  Zelnorm 6 mg b.i.d.  2.  Reglan 10 mg a.c. and h.s.   FOLLOW UP:  She is scheduled to be seen in the office in 2 weeks.   HISTORY OF PRESENT ILLNESS:  A 45 year old female with a history of  recurrent severe abdominal pain of 1-day duration.  She had nausea without  vomiting.  Her pain was in her epigastrium.  She was seen in the emergency  room where she was having uncontrolled pain.  She had been evaluated by Dr.  Jena Gauss with no abnormal findings.  She was felt to have refractory GERD.  CT  scan was done and this suggested partial proximal small bowel obstruction.   PHYSICAL EXAMINATION:  VITAL SIGNS:  She was afebrile with stable vital  signs, but appeared to be in acute distress.  ABDOMEN:  Abdomen was distended with moderate tenderness in left upper  quadrant and epigastrium.  She had normal active bowel sounds.  It was felt  that the patient had more of an adynamic ileus.   HOSPITAL COURSE:  Nasogastric tube was inserted and placed to suction.  Small bowel follow through was done and this showed mild dilatation of the  proximal small bowel with normal distal bowel with no evidence of  obstruction and no transition point.  Nasogastric tube was therefore  discontinued.  Her diet was advanced.  Followup abdominal series showed  dilated loops of small bowel in the  mid abdomen without air-fluid levels.  She had contrast  in her nondilated colon down to the sigmoid.  She was started on Zelnorm.  After that she had multiple bowel movements.  Anorexia, nausea, vomiting  resolved.  By July 3, she was stable without pain, afebrile with normal  abdominal exam and was discharged home in satisfactory condition.      Dirk Dress. Katrinka Blazing, M.D.  Electronically Signed     LCS/MEDQ  D:  07/07/2005  T:  07/08/2005  Job:  [id number]

## 2011-03-29 NOTE — Consult Note (Signed)
NAME:  Angie Powell, Angie Powell               ACCOUNT NO.:  [id number]   MEDICAL RECORD NO.:  [id number]         PATIENT TYPE:  AMB   LOCATION:                                FACILITY:  [facility]   PHYSICIAN:  Angie Powell, M.D. DATE OF BIRTH:  10-25-1966   DATE OF CONSULTATION:  03/06/2005  DATE OF DISCHARGE:                                   CONSULTATION   GASTROENTEROLOGY CONSULTATION:   REQUESTING PHYSICIAN:  Angie Powell   REASON FOR CONSULTATION:  Refractory GERD, epigastric pain.   HISTORY OF PRESENT ILLNESS:  Angie Powell is a 45 year old African-American  female patient of Angie Powell who reports a 3-week history of refractory  GERD-type symptoms generally postprandial and she describes postprandial  epigastric pain stabbing, she rates the pain 6/10 on pain scale.  She has  been giving a trial of Zegerid 40 mg b.i.d. along with Carafate and Reglan  10 mg before meals and at bedtime and Zantac 150 mg b.i.d.  She reports that  her symptoms were somewhat relieved with this for the last 4 days.  At this  time she is only on Zegerid 40 mg daily alone.  She denies any nausea or  vomiting, denies any dysphagia or odynophagia, she notes her symptoms are  typically worse with stress.  Her bowel movements have been daily, soft and  brown, without any rectal bleeding or melena.   PAST MEDICAL HISTORY:  1.  Asthma.  2.  Chronic GERD for at least 10 years with last EGD by Dr. Jena Gauss on      December 29, 1998.  She had a normal EGD.  3.  History of arthritis.  4.  She is Helicobacter pylori negative.  5.  Tonsillectomy and appendectomy.   CURRENT MEDICATIONS:  Zegerid 40 mg p.o. daily, Claritin p.Angien., Tylenol  p.Angien., Aleve p.Angien., Advil p.Angien., albuterol p.Angien. and Combivent p.Angien.   ALLERGIES:  No known allergies.   FAMILY HISTORY:  No known family history of colorectal carcinoma, liver or  chronic GI problems.  Mother age 37 has history of diabetes mellitus and  father age 64 is healthy.   She has one healthy brother.   SOCIAL HISTORY:  Angie Powell has been married for 12 years.  She is employed  full time at [employer] in [place].  She denies any tobacco, alcohol or drug  use.   REVIEW OF SYSTEMS:  CONSTITUTIONAL:  Weight is stable.  Denies any fever or  chills.  Appetite is good.  Denies any early satiety.  CARDIOVASCULAR:  Denies any chest pain except for with the episode about a month ago she did  has some atypical-type chest pain.  PULMONARY:  Denies any cough, shortness  of breath, dyspnea or hemoptysis.  GI:  See HPI.  GYN:  LMP about 2 weeks  ago.  She had normal 28-day cycle.   PHYSICAL EXAMINATION:  VITAL SIGNS:  Weight 231 pounds, height 66-1/2  inches, temperature 98.6, blood pressure 128/80, pulse 68.  GENERAL:  Angie Powell is a 45 year old obese African-American female who is  alert, oriented, pleasant, cooperative, no acute distress.  HEENT:  Sclerae clear.  Nonicteric.  Conjunctivae pink.  Oropharynx pink and  moist without any lesions.  NECK:  Supple without mass or thyromegaly.  CHEST:  Heart regular rate and rhythm with 3/6 murmur noted.  LUNGS:  Clear to auscultation bilaterally.  ABDOMEN:  Protuberant with positive bowel sounds x4.  No bruits auscultated.  She does have a well-healed vertical midline scar from previous surgery.  There is no rebound tenderness or guarding, no hepatosplenomegaly or mass.  RECTAL:  Deferred.  EXTREMITIES:  No edema bilaterally.  SKIN:  Bilaterally warm and dry without rash or jaundice.   IMPRESSION:  Angie Powell is a 45 year old African-American female with a  history of chronic gastroesophageal reflux disease who had a 2- to 3-week  history of uncontrolled epigastric pain refractory gastroesophageal reflux  disease type symptoms that has responded to H2 blocker, Carafate and  promotility agent along with Zegerid.  She is currently being maintained on  Zegerid 40 mg daily and her symptoms are somewhat although not 100%  better  on this therapy; therefore, I feel further evaluation is warranted to rule  out complications of a refractory and chronic gastroesophageal reflux  disease.  She does have gallbladder in situ however, she does not have  symptoms typical of gallbladder disease.   RECOMMENDATIONS:  1.  We will schedule EGD with Dr. Jena Gauss in the near future.  I have      discussed both procedures including risks and benefits including but not      limited to bleeding, infection, perforation, drug reaction.  Patient      consent will be obtained.  2.  We will give her SBE prophylaxis given her history of heart murmur.  3.  Further recommendations to follow.  She should continue Zegerid 40 mg      daily and since she has had great relief with the above therapy if she      develops refractory symptoms in the near future she may need to remain      on Reglan.      KC/MEDQ  D:  03/06/2005  T:  03/06/2005  Job:  [id number]

## 2011-03-29 NOTE — Op Note (Signed)
[facility]  Patient:    Angie Powell                       MRN: [id number] Proc. Date: 09/25/99 Adm. Date:  [id number] Attending:  Rhina Brackett                           Operative Report  PREOPERATIVE DIAGNOSIS:       Abnormal uterine bleeding/endometrial polyps.  POSTOPERATIVE DIAGNOSIS:      Abnormal uterine bleeding/endometrial polyps.  OPERATION:                    Dilation and curettage, hysteroscopy.  SURGEON:                      Duke Salvia. Marcelle Overlie, M.D.  ANESTHESIA:                   Sedation and paracervical block.  COMPLICATIONS:                None.  DESCRIPTION OF PROCEDURE:     Patient was taken to the operating room after an adequate level of sedation was obtained and with legs in the stirrups, perineum and vagina prepped and draped in the usual manner for D&C, hysteroscopy.  The bladder was drained.  EUA carried out.  Uterus was normal size, mid to slightly posterior, adnexa negative.  Speculum was positioned.  Xylocaine plain 1% was used to infiltrate the anterior cervical lip which was then grasped with a tenaculum. Paracervical block created by infiltrating at 3 and 9 oclock submucosally, 5-7 c of 1% Xylocaine after negative aspiration.  Once this was accomplished, the uterus was sounded to 9 cm in a mid to slightly posterior direction.  The cervix was already reasonably dilated, progressively dilated to 32/33 [facility] dilator.  The diagnostic hysteroscope could easily be inserted.  A large amount of endometrial tissue with what may be endometrial polyps was noted precluding an excellent view, therefore the scope was removed.  Polyp forceps were used to explore the cavity, revealing some polypoid tissue and a D&C was then carried out with a moderate amount of further tissue noted.  When no further tissue was seen, the hysteroscope was reinserted, cavity was rinsed out and a much better view was obtained  at that point.  There was no residual endometrial tissue or polyps noted.  The cavity itself appeared to be normal except just at the fundus where there was some what appeared to be base of a fundal intramural fibroid that I could see from the internal side.  This was not bulging into the cavity but did look suspicious for the base of a fibroid.  No further polypoid tissue was noted.  There was no bleeding.  Instruments were removed.  She was doing well at that point and sent to recovery room. DD:  09/25/99 TD:  09/26/99 Job: [id number]

## 2011-03-29 NOTE — Discharge Summary (Signed)
[facility]  Patient:    Angie Powell, Angie Powell Visit Number: [id number] MRN: [id number]          Service Type: MED Location: 3A A320 01 Attending Physician:  Evlyn Courier Dictated by:   Mirna Mires, M.D. Admit Date:  04/07/2002 Discharge Date: 04/09/2002                             Discharge Summary  DATE OF BIRTH:  1965-12-09.  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  The patient came to the emergency room for evaluation of headache.  Headache was confined to left side of head. Labs were ordered as a part of work-up for headache.  CT of the head was read as negative as ordered by the emergency room physician.  Labs in the emergency room revealed a hemoglobin of 6.6, and a hematocrit of 20.3.  History was significant for menstrual flow x2 weeks.  Patient describes the flow as steady.  She had to use 8 to 10 vaginal pads per day.  She also noticed pain initially involving the pelvic area the first two days of her menstrual cycle. Her last menstrual cycle was in February 2003, x4 to 5 days.  Her menstrual cycle was irregular.  She denied chest pain, syncope, dizziness, palpitations, and chest pain.  Her history was positive for feeling cold.  PAST MEDICAL HISTORY: 1. Medical history was positive for asthma. 2. Gastroesophageal reflux disease. 3. 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  ALLERGIES:  The patient was allergic to RAGWEED, DUST, MITES, and GRASS.  The patient was not allergic to any prescribed medication.  HABITS:  Negative for tobacco, ethanol or street drugs.  FAMILY HISTORY:  Revealed mother living at age 40, with a history of diabetes mellitus; father living at age 62 in good health; 1 brother living at age 2 in good health; 1 daughter living at age 60 in good health.   Family history is positive for breast cancer (paternal aunt).  PAST MEDICAL HISTORY: 1. Positive for hospitalization for pregnancy in 1998. 2. Intestinal infection at age 21 at [facility]. 3. Tonsillectomy at [facility] at age 16. 4. Appendectomy at [facility] at age 57.  PROBLEM LIST: #1 - SEVERE ANEMIA SECONDARY TO HYPERMENORRHEA:  PHYSICAL EXAMINATION:  General appearance revealed a middle aged, medium height, overweight, black female in no apparent respiratory distress.  VITAL SIGNS:  Temperature 97.7, pulse 70, respirations 16, and blood pressure 132/73.  HEENT:  Sclerae were white.  Conjunctivae was pale.  LUNGS:  Clear.  HEART:  Revealed audible S1 S2 without murmur, rub or gallop.  Rhythm was regular and rate was within normal limits.  ABDOMEN:  Obese with hypoactive bowel sounds.  Abdominal examination was positive for healed old right lower quadrant surgical scar.  Abdomen was soft and nontender in all four quadrants.  Abdominal examination demonstrated no palpable mass or organomegaly.  PELVIC:  Deferred to gynecologist.  NEUROLOGIC: The patient was neurologically intact.  OTHER SIGNIFICANT LABORATORY DATA ON ADMISSION:  White count 8.9, platelet 415,000.  Prothrombin time 13.0, INR 1.1, sodium 138, potassium 4.4, chloride 106, CO2 32, glucose 98, BUN 11, creatinine 0.8.  Calcium 8.4.  Total protein 6.3, albumin 3.5, SGOT 23, SGPT 19, alk. phos. 77.  Total bilirubin 0.4, urine pregnancy test negative.  The patient was treated with oxygen at 2 liters per minute via nasal cannula, IV fluids, transfusion of 2 units of typed and crossed packed red blood cells, gynecology consult, anemia panel, and iron supplement.  The patient was transfused without problem.  Repeat hemoglobin was 9.7, hematocrit 28.8, on Apr 08, 2002.  Platelets was 368,000.  Pelvic ultrasound was read as thickening, endometrial stripe, towards the fundus with a cystic  lesion along the margin of the endometrium which could represent a small degenerative fibroid or adenomyoma or less likely a small amount of fluid in the right cornua off the uterus.  If clinically warranted a hystosonography or a pelvic MRI could be utilized to further evaluate the endometrium.  A cyst was seen along the superior margin of the right ovary similar to the prior CT scan as read by Dr. Soyla Murphy. Liebkemann.  A gynecologic consult was done on Apr 08, 2002, by Dr. Duane Lope.  His impression was anovulatory uterine bleeding.  Dr. Despina Hidden elected to give her a dose of Lupron 3.75 mg to totally suppress her ovaries and her endometrium.  She also received a stat dose of Megace to help stabilize her endometrium. Dr. Despina Hidden indicated that the libron would take a couple of days to stop her bleeding.  He encouraged her to see her physician at [facility] for evaluation and management in 2-3 weeks.  The patient was discharged home on Apr 09, 2002.  She had no complaint of dizziness, shortness of breath, feeling cold, chest pain.  #2 - GASTROESOPHAGEAL REFLUX DISEASE:  The patient was treated with Prevacid 30 mg p.o. every day.  She had no complaint of heartburn, epigastric pain, nausea or vomiting during this hospitalization.  #3 - SEASONAL ALLERGY:  The patient was treated with nasacort nasal spray each nostril for nasal congestion.  Examination of the nasal canal revealed mild edema and no discharge.  The patient was not complaining of any stuffy nose or runny nose at time of discharge.  #4 - ASTHMA:  Examination of the lung fields on admission were clear.  The patient had no complaint of wheezing or shortness of breath during this hospitalization.  She was treated with combivent metered dose inhaler 2 puffs q.i.d.  INSTRUCTIONS AT TIME OF DISCHARGE:  DIET:  No restriction.  ACTIVITY:  No restriction.  MEDICATIONS: 1. Ferrous sulfate 325 mg 2 tablets p.o. every day. 2.  Prevacid 30 mg 1 capsule p.o. every day.  3. Nasacort nasal spray 2 sprays each nostril daily. 4. Colace 2 tablets p.o. at bedtime.  FOLLOWUP: 1. Follow up with Dr. Lodema Hong in 1 week. 2. Follow up with Dr. Despina Hidden in 1 week.  PRIMARY DIAGNOSES:  Severe anemia secondary to anovulatory uterine bleeding.  SECONDARY DIAGNOSES: 1. Gastroesophageal reflux disease. 2. Asthma. 3. Seasonal allergies. 4. Osteoarthritis. Dictated by:   Mirna Mires, M.D. Attending Physician:  Evlyn Courier DD:  04/12/02 TD:  04/13/02 Job: [id number]

## 2011-03-29 NOTE — Consult Note (Signed)
[facility]  Patient:    Angie Powell, Angie Powell Visit Number: [id number] MRN: [id number]          Service Type: MED Location: 3A A320 01 Attending Physician:  Annamarie Dawley Proc. Date: 04/08/02 Admit Date:  04/07/2002                            Consultation Report  HISTORY OF PRESENT ILLNESS:  The patient is a 45 year old, gravida 3, para 1, abortus 2, with last menstrual period which started two weeks ago.  She presented to the ER on Apr 07, 2002, complaining of headache and feeling weak and nauseated.  The patient had a CT scan of the head which was negative, but her hemoglobin and hematocrit were 6.6 and 20.3.  The patient states that she has been bleeding very heavy for the last two weeks.  She has a history of anovulatory uterine bleeding.  She will go three to four months at a time, sometimes up to five months, without any menses and can have quite heavy periods when she does.  She has been evaluated by physicians both in [place], [place], and at [place] for infertility. She is about to begin a course of ______ for follicular development.  She did not become pregnant on many, many months of Clomid.  The patient is still bleeding heavily now.  She had an ultrasound which revealed no fibroids, a thickened endometrial stripe of 1.8 cm, and the rest of the pelvic sonogram was normal. The rest of her past history, medications, allergies, etc., are on the chart.  The patient is now status post three units of packed red blood cells and her H&H is still only 9.7 and 28.8 as of 11:30 a.m. today.  IMPRESSION: 1. Anovulatory uterine bleeding. 2. Menometrorrhagia. 3. Severe anemia.  PLAN:  The patient does indeed desire ongoing fertility evaluation.  It would be in keeping with her current course in that she is supposed to start ______ appropriately in the near future to go ahead and give her a dose of Lupron 3.75 mg to totally suppress her  ovaries and her endometrium.  She is also going to get a stat dose of Megace now to help stabilize her endometrium.  The Lupron may take a couple of days to get her bleeding stopped, but should certainly be the appropriate course over the next month.  She is encouraged to then see her physicians at [facility] for evaluation and management in about two to three weeks so she can begin ______ before her month of Lupron effectiveness wears out.  She may be discharged in a few more days of Megace to ensure her bleeding has stopped. Attending Physician:  Annamarie Dawley DD:  04/08/02 TD:  04/08/02 Job: [id number]

## 2011-03-29 NOTE — Op Note (Signed)
NAME:  Angie Powell, Angie Powell               ACCOUNT NO.:  [id number]   MEDICAL RECORD NO.:  [id number]          PATIENT TYPE:  AMB   LOCATION:  DAY                           FACILITY:  [facility]   PHYSICIAN:  R. Roetta Sessions, M.D. DATE OF BIRTH:  May 13, 1966   DATE OF PROCEDURE:  03/12/2005  DATE OF DISCHARGE:                                 OPERATIVE REPORT   PROCEDURE:  Diagnostic esophagogastroduodenoscopy.   INDICATIONS FOR PROCEDURE:  The patient is a 45 year old lady with  intermittent epigastric pain. Gallbladder ultrasound reportedly normal. Also  has significant reflux symptoms on Zegerid 40 mg orally daily. EGD is now  being done to further evaluate her symptoms. This approach has been  discussed with the patient at length. Potential risks, benefits, and  alternatives have been reviewed and questions answered. She is agreeable.  Please see the documentation in the medical record.   PROCEDURE NOTE:  O2 saturation, blood pressure, pulse, and respirations were  monitored throughout the entire procedure. Conscious sedation with Versed 3  mg IV and Demerol 75 mg IV in divided doses. Ampicillin 2 g IV, gentamicin  120 mg IV. Cetacaine spray for topical oropharyngeal anesthesia.   INSTRUMENT:  Olympus video chip system.   FINDINGS:  Examination of the tubular esophagus revealed no mucosal  abnormalities. EG junction was easily traversed.   Stomach:  Gastric cavity was empty and insufflated well with air. Thorough  examination of gastric mucosa including retroflexed view of the proximal  stomach and esophagogastric junction demonstrated no abnormalities. Pylorus  patent and easily traversed. Examination of the bulb and second portion  revealed no abnormalities.   THERAPEUTIC/DIAGNOSTIC MANEUVERS:  None.   The patient tolerated the procedure well and was reactive to endoscopy.   IMPRESSION:  Normal esophagus, stomach, D1 and D2.   RECOMMENDATIONS:  1.  Will proceed with a HIDA with  fatty meal challenge later in the week.      Continue Zegerid.  2.  Serum amylase, lipase, LFTs, CBC today.  3.  Further recommendations to follow.      RMR/MEDQ  D:  03/12/2005  T:  03/12/2005  Job:  [id number]   cc:   Lodema Hong, M.D.

## 2011-03-29 NOTE — H&P (Signed)
[facility]  Patient:    Angie Powell, Angie Powell Visit Number: [id number] MRN: [id number]          Service Type: MED Location: 3A A320 01 Attending Physician:  Annamarie Dawley Dictated by:   Mirna Mires, M.D. 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  The patient came to the emergency room for evaluation of headache.  The headache was confined to the left side of the head.  The duration of the headache was six to eight hours.  The headache was described as a severe constant pressure. The history was also positive for nausea with headache.  The patient had a CT of the head which was read as negative as ordered by the emergency room physician.  Labs by the emergency room physician revealed findings consistent with severe anemia.  The hemoglobin was 6.6 and the hematocrit was 20.3.  The history was significant for menstrual flow x 2 weeks.  The patient described the flow as steady.  The patient has been using 8-10 vaginal pads per day. She also noticed pain initially involving the pelvic area the first two days of her menstrual cycle.  Her last menstrual cycle was in February of 2003 x 4-5 days.  Her menstrual cycle is irregular.  She denies chest pain, syncope, dizziness, palpitations, and chest pain.  Her history is positive for feeling cold.  PAST MEDICAL HISTORY:  Positive for asthma, gastroesophageal reflux disease, osteoarthritis of the right ankle, and allergies.  Her medical history is negative for hypertension, diabetes, tuberculosis, cancer, sickle cell disease, and seizure disorder.  Sexually transmitted disease history negative for gonorrhea, syphilis, herpes, and HIV infection.   Positive for hospitalization for pregnancy in 1998, intestinal infection at age 14 at [facility], tonsillectomy at [facility] at age 34, and appendectomy at [facility] at age 30.  MEDICATIONS:  Prescribed medications are: 1. Prevacid 30 mg p.o. every day. 2. Clarinex 5 mg p.o. every day by Clydie Braun, M.D. 3. Proventil metered dose inhaler two puffs every four to six hours p.r.n. 4. Nasacort nasal spray two sprays to each nostril every day. 5. Self-administered allergy shot a home prescribed by Clydie Braun, M.D.  ALLERGIES:  The patient is allergic to ragweed, dust, mites, and grass.  She is not allergic to any prescribed medications.  HABITS:  Negative for tobacco, ethanol, and street drugs.  FAMILY HISTORY:  Mother living at age 7 with a history of diabetes mellitus. Father living at age 39 in good health.  One brother living at age 73 in good health.  One daughter living at age 19 in good health.  The family history is positive for breast cancer in a paternal aunt.  REVIEW OF SYSTEMS:  Positive for episodic wheezing, episodic shortness of breath, sneezing spell, nasal congestion, and occasional right ankle pain.  REVIEW OF SYSTEMS:  Negative for epistaxis, hemoptysis, bleeding gums, double vision, melena, constipation, stomach pain, diarrhea, night sweats, dysuria, gross hematuria, vaginal discharge, lumps in breasts, nipple discharge, weight loss, etc.  PHYSICAL EXAMINATION:  GENERAL APPEARANCE:  A middle-aged, medium  height, overweight, black female in no apparent respiratory distress.  VITAL SIGNS:  Vitals on the floor showed temperature 97.7 degrees, pulse 70, respirations 16, and blood pressure 132/73.  HEENT:  Head:  Normocephalic.  Negative for nodules, contusion, or laceration. Ears:  Normal auricle.  External canals patent.  Tympanic membranes pearly gray.  Eyes:  Lids negative for ptosis.  Sclerae white.   Conjunctivae slightly pale.  Pupils round, equal, and reactive to light.  Extraocular movements intact.  Nose:  Negative discharge.  Mouth:  Dentition good.  No bleeding gums.  No oral lesions.  Posterior pharynx benign.  NECK:  Negative for lymphadenopathy or thyromegaly.  LUNGS:  Clear.  HEART:  Audible S1 and S2 without murmurs, rubs, or gallops.  Regular rate and rhythm.  BREASTS:  No skin changes.  No nodule on palpation.  Nipple erect without discharge.  ABDOMEN:  Obese.  Hypoactive bowel sounds.  Positive for healed old right lower quadrant surgical scar.  Soft and nontender in all four quadrants.  No palpable masses or organomegaly.  PELVIC:  Deferred to gynecologist.  RECTAL:  Deferred.  EXTREMITIES:  Lower knees positive for crepitus bilaterally.  Tibia with no edema.  Feet with palpable dorsalis pedis bilaterally.  No clubbing or cyanosis pertaining to the extremities.  NEUROLOGIC:  Alert and oriented to person, place, and time.  Cranial nerves II-XII appeared intact.  LABORATORY DATA ON ADMISSION:  White count 8.9, RBC 2.68, hemoglobin 6.6, hematocrit 20.3, platelets 415,000.  Prothrombin time 13.0, INR 1.1. Sodium 138, potassium 4.4, chloride 106, CO2 32, glucose 98, BUN 11, creatinine 0.8, calcium 8.4, total protein 6.3, albumin 3.5, SGOT 23, SGPT 19, alkaline phosphatase 77, total bilirubin 0.4.  Urine pregnancy test negative.  PRIMARY DIAGNOSIS:  Severe anemia secondary to hypermenorrhea.  SECONDARY DIAGNOSES: 1. Gastroesophageal reflux disease. 2. Asthma. 3. Seasonal allergies. 4. Osteoarthritis.  PLAN:  1. IV fluids.  2. Type and cross and transfuse three units of typed and crossed packed red     blood cells.  3. Reticulocyte count and anemia panel before transfusion.  4. O2 at 2 L/min via nasal cannula.  5. IV fluids.  6. Gynecological consult.  7. Combivent metered dose inhaler two puffs every six hours.  8. Colace 100 mg two tablets p.o. at  bedtime.  9. Regular diet. 10. A CBC two hours after the last unit of blood.  11. Prevacid 30 mg p.o. every day. 12. Nasacort two sprays to each nostril daily. Dictated by:   Mirna Mires, M.D. Attending Physician:  Annamarie Dawley DD:  04/08/02 TD:  04/08/02 Job: [id number]

## 2011-04-04 ENCOUNTER — Other Ambulatory Visit: Payer: Self-pay | Admitting: Otolaryngology

## 2011-04-04 DIAGNOSIS — E041 Nontoxic single thyroid nodule: Secondary | ICD-10-CM

## 2011-04-09 ENCOUNTER — Other Ambulatory Visit: Payer: Self-pay | Admitting: Otolaryngology

## 2011-04-09 DIAGNOSIS — E042 Nontoxic multinodular goiter: Secondary | ICD-10-CM

## 2011-04-10 ENCOUNTER — Ambulatory Visit
Admission: RE | Admit: 2011-04-10 | Discharge: 2011-04-10 | Disposition: A | Discharge status: Home / Self Care | Payer: 59 | Source: Ambulatory Visit | Attending: Otolaryngology | Admitting: Otolaryngology

## 2011-04-10 ENCOUNTER — Other Ambulatory Visit (HOSPITAL_COMMUNITY)
Admission: RE | Admit: 2011-04-10 | Discharge: 2011-04-10 | Disposition: A | Discharge status: Home / Self Care | Payer: 59 | Source: Ambulatory Visit | Attending: Interventional Radiology | Admitting: Interventional Radiology

## 2011-04-10 ENCOUNTER — Other Ambulatory Visit: Payer: Self-pay | Admitting: Interventional Radiology

## 2011-04-10 DIAGNOSIS — E049 Nontoxic goiter, unspecified: Secondary | ICD-10-CM | POA: Insufficient documentation

## 2011-04-10 DIAGNOSIS — E042 Nontoxic multinodular goiter: Secondary | ICD-10-CM

## 2011-04-10 DIAGNOSIS — E041 Nontoxic single thyroid nodule: Secondary | ICD-10-CM

## 2011-04-15 ENCOUNTER — Telehealth: Payer: Self-pay | Admitting: Family Medicine

## 2011-04-16 NOTE — Telephone Encounter (Signed)
Pt advised she needs to go through mental health for the leave sghe requires, states needs about 6 weeks out really stressed, she will contact theses ervices through her job

## 2011-04-22 ENCOUNTER — Encounter: Payer: Self-pay | Admitting: Family Medicine

## 2011-04-25 ENCOUNTER — Encounter: Payer: Self-pay | Admitting: Family Medicine

## 2011-04-25 ENCOUNTER — Ambulatory Visit (INDEPENDENT_AMBULATORY_CARE_PROVIDER_SITE_OTHER): Payer: 59 | Admitting: Family Medicine

## 2011-04-25 ENCOUNTER — Encounter: Payer: Self-pay | Admitting: *Deleted

## 2011-04-25 VITALS — BP 120/80 | HR 76 | Resp 16 | Ht 66.5 in | Wt 221.0 lb

## 2011-04-25 DIAGNOSIS — F329 Major depressive disorder, single episode, unspecified: Secondary | ICD-10-CM

## 2011-04-25 DIAGNOSIS — J45909 Unspecified asthma, uncomplicated: Secondary | ICD-10-CM

## 2011-04-25 DIAGNOSIS — E041 Nontoxic single thyroid nodule: Secondary | ICD-10-CM

## 2011-04-25 DIAGNOSIS — F32A Depression, unspecified: Secondary | ICD-10-CM

## 2011-04-25 DIAGNOSIS — E669 Obesity, unspecified: Secondary | ICD-10-CM

## 2011-04-25 DIAGNOSIS — J309 Allergic rhinitis, unspecified: Secondary | ICD-10-CM

## 2011-04-25 MED ORDER — PAROXETINE HCL 20 MG PO TABS: 20.0000 mg | ORAL_TABLET | Freq: Every day | ORAL | Status: DC

## 2011-04-25 MED ORDER — TEMAZEPAM 15 MG PO CAPS: 15.0000 mg | ORAL_CAPSULE | Freq: Every evening | ORAL | Status: DC | PRN

## 2011-04-25 NOTE — Patient Instructions (Addendum)
F/u in 3 months.  The dose of paxil is increased to 20mg  daily, OK to take TWO 10mg  tabs, and you will start a new med for sleep. pls practice good sleep hygiene also. Try to start daily exercise for 30 minutes this will help how you feel.  I will take you out of work for 1 week and try and we will  get the soonest available psych appt

## 2011-04-25 NOTE — Progress Notes (Signed)
  Subjective:    Patient ID: Angie Powell, female    DOB: 09/21/1966, 45 y.o.   MRN: [id number]  HPI Pt reports poor sleep, cyring all the time, overwhelmed, unable to concentrate on her job, needs a prolonged period of time out for mental health reasons. Currently seeing a therapist through her job.Not suicidal , nor homicidal, denies hallucinations. She reports insomnia, difficulty both falling and staying asleep. She sleeps on average 4 to 5 hours  Per night, and sleep is broken.  She does feel as though the paxil is helping some with her depression and anxiety, but reports being less than 30% in terms of her as ability to focus and concentrate.Dose increase on the medication is a viable option as well as additional med for sleep. Sleep hygiene is also discussed She is able to take care of her basic needs, but motivation to do so is challenged. Feels as though she needs a prolonged time period,I explain that for this she will absolutely need to see a psychiatrist as well as to continue with therapy , for job protection as well as to maintain an income. There is no doubt that she clearly needs the time, the strain of separation and the preceding approximately 6 months leading to it have taken a great emotional toll, and she needs help to deal with this.  Reports good control of her allergies on current medication     Review of Systems Denies recent fever or chills. Denies sinus pressure, nasal congestion, ear pain or sore throat. Denies chest congestion, productive cough or wheezing. Denies chest pains, palpitations, paroxysmal nocturnal dyspnea, orthopnea and leg swelling Denies abdominal pain, nausea, vomiting,diarrhea or constipation.   Denies dysuria, frequency, hesitancy or incontinence. Denies joint pain, swelling and limitation in mobility.  Denies skin break down or rash.        Objective:   Physical Exam Patient alert and oriented and in no Cardiopulmonary  distress.  HEENT: No facial asymmetry, EOMI, no sinus tenderness,  Oropharynx pink and moist.  Neck supple .  Chest: Clear to auscultation bilaterally.  CVS: S1, S2 no murmurs, no S3.  ABD: Soft non tender. Bowel sounds normal.  Ext: No edema  MS: Adequate ROM spine, shoulders, hips and knees.  Skin: Intact, no ulcerations or rash noted.  Psych: Good eye contact, tearful, depressed appearing, memory intact.  CNS: CN 2-12 intact, power, tone and sensation normal throughout.        Assessment & Plan:

## 2011-04-25 NOTE — Assessment & Plan Note (Addendum)
Deteriorated , dose inc and add restoril, pt referred to psychiatry since she reports being unable to work in her current state and the need for a prolonged time out of workl

## 2011-04-26 ENCOUNTER — Telehealth: Payer: Self-pay | Admitting: Family Medicine

## 2011-04-26 NOTE — Telephone Encounter (Signed)
Thought I was in another patient's chart.

## 2011-04-30 ENCOUNTER — Telehealth: Payer: Self-pay | Admitting: Family Medicine

## 2011-04-30 NOTE — Telephone Encounter (Signed)
Spoke with Vincenza Hews and gave fax number that he can fax request to

## 2011-05-01 ENCOUNTER — Telehealth: Payer: Self-pay | Admitting: Family Medicine

## 2011-05-02 ENCOUNTER — Telehealth: Payer: Self-pay | Admitting: Family Medicine

## 2011-05-02 NOTE — Telephone Encounter (Signed)
Pt has been called and told the number to call for help and get set up for appt. She also was told to call office and give the date and time. But haven't heard anything. Pt is very aware

## 2011-05-03 NOTE — Assessment & Plan Note (Signed)
Controlled, no change in medication  

## 2011-05-03 NOTE — Telephone Encounter (Signed)
msg left attemptiing to return call, of note, the disability forms are for a behavioral health provider as I have repeatedly explained to the pt , she will need psychiatry to take her out for a prolonged period

## 2011-05-03 NOTE — Assessment & Plan Note (Signed)
Pt has been evaluated by ENT since last visit

## 2011-05-03 NOTE — Assessment & Plan Note (Signed)
Improved. Pt applauded on succesful weight loss through lifestyle change, and encouraged to continue same. Weight loss goal set for the next several months.  

## 2011-05-07 ENCOUNTER — Telehealth: Payer: Self-pay | Admitting: Family Medicine

## 2011-05-07 NOTE — Telephone Encounter (Signed)
Glad to know she is seeing psych , she needs that

## 2011-05-24 ENCOUNTER — Telehealth: Payer: Self-pay | Admitting: *Deleted

## 2011-05-27 NOTE — Telephone Encounter (Signed)
Patient aware.

## 2011-05-27 NOTE — Telephone Encounter (Signed)
I have explained to Angie Powell that a psychiatrist is needed to take her out of work for the prolonged period she requires,on several occasions. she had an appt scheduled with psychiatry, so I am unclear as to what she is requesting

## 2011-06-13 ENCOUNTER — Ambulatory Visit (HOSPITAL_COMMUNITY): Payer: 59 | Admitting: Psychiatry

## 2011-06-19 ENCOUNTER — Encounter: Payer: Self-pay | Admitting: Family Medicine

## 2011-06-20 ENCOUNTER — Ambulatory Visit: Payer: 59 | Admitting: Family Medicine

## 2012-06-30 ENCOUNTER — Encounter: Payer: Self-pay | Admitting: Family Medicine

## 2012-06-30 ENCOUNTER — Encounter: Payer: 59 | Admitting: Family Medicine

## 2012-07-07 ENCOUNTER — Encounter: Payer: 59 | Admitting: Family Medicine

## 2012-07-09 ENCOUNTER — Encounter: Payer: Self-pay | Admitting: Family Medicine

## 2012-07-09 ENCOUNTER — Ambulatory Visit (INDEPENDENT_AMBULATORY_CARE_PROVIDER_SITE_OTHER): Payer: 59 | Admitting: Family Medicine

## 2012-07-09 VITALS — BP 122/74 | HR 86 | Resp 18 | Ht 66.5 in | Wt 225.0 lb

## 2012-07-09 DIAGNOSIS — G4700 Insomnia, unspecified: Secondary | ICD-10-CM

## 2012-07-09 DIAGNOSIS — F329 Major depressive disorder, single episode, unspecified: Secondary | ICD-10-CM

## 2012-07-09 DIAGNOSIS — F32A Depression, unspecified: Secondary | ICD-10-CM

## 2012-07-09 DIAGNOSIS — F3289 Other specified depressive episodes: Secondary | ICD-10-CM

## 2012-07-09 MED ORDER — PAROXETINE HCL 10 MG PO TABS: 10.0000 mg | ORAL_TABLET | Freq: Every day | ORAL | Status: DC

## 2012-07-09 NOTE — Patient Instructions (Addendum)
Start the paxil once a day  Use the klonopin and Restoril  Referral to psychiatry/therapy in [place]  F/U 2 weeks

## 2012-07-10 DIAGNOSIS — F418 Other specified anxiety disorders: Secondary | ICD-10-CM | POA: Insufficient documentation

## 2012-07-10 DIAGNOSIS — F5105 Insomnia due to other mental disorder: Secondary | ICD-10-CM | POA: Insufficient documentation

## 2012-07-10 NOTE — Assessment & Plan Note (Signed)
Recurrent depression and difficulty dealing with her recent separation. I will refer her to psychiatry in [place] at her request along with therapy. We will go ahead and restart the Paxil at 10 mg daily. She can also restart her temazepam for insomnia as needed and Xanax for anxiety. She was contracted for safety today she has no active ideations or suicidal ideation.

## 2012-07-10 NOTE — Assessment & Plan Note (Signed)
Temazepam as needed. 

## 2012-07-10 NOTE — Progress Notes (Signed)
  Subjective:    Patient ID: Angie Powell, female    DOB: 11/11/1966, 46 y.o.   MRN: [id number]  HPI Patient presents for depression. She was treated for depression and anxiety one year ago after initial separation from her husband. At that time she was treated with Paxil, temazepam and Xanax by her psychiatrist. She is now officially divorced from her husband but is having difficulty with history of their daughter mentally. She finds herself stress and a lot of crying and has had passive suicidal ideations however no plan. She's been trying to deal with the increase stress for the past month but feels she needs medications and therapy again. She's not tried any of her Restoril or Xanax which yard he had at home. At times she sleeps more than she typically does notice that this is unusual for her. Her appetite is good however she has difficulty concentrating at work.   Review of Systems - per above   GEN- denies fatigue, fever, weight loss,weakness, recent illness HEENT- denies eye drainage, change in vision, nasal discharge, CVS- denies chest pain, palpitations RESP- denies SOB, cough, wheeze ABD- denies N/V, change in stools, abd pain GU- denies dysuria, hematuria, dribbling, incontinence MSK- denies joint pain, muscle aches, injury Neuro- denies headache, dizziness, syncope, seizure activity      Objective:   Physical Exam GEN-NAD,alertand oriented x 3 Psych- depressed affect, crying during exam, no active SI, no hallucinations, normal thought process, good eye contact PHQ-9 19, with question 9 positive for thoughts of hurting herself        Assessment & Plan:    approx 20 minutes spent with pt > 50% of time spent on counseling for depression

## 2012-07-24 ENCOUNTER — Ambulatory Visit: Payer: 59 | Admitting: Family Medicine

## 2012-08-04 ENCOUNTER — Ambulatory Visit: Payer: 59 | Admitting: Family Medicine

## 2012-08-31 ENCOUNTER — Encounter: Payer: Self-pay | Admitting: Family Medicine

## 2012-08-31 ENCOUNTER — Ambulatory Visit (INDEPENDENT_AMBULATORY_CARE_PROVIDER_SITE_OTHER): Payer: 59 | Admitting: Family Medicine

## 2012-08-31 VITALS — BP 138/72 | HR 68 | Resp 18 | Ht 66.0 in | Wt 221.1 lb

## 2012-08-31 DIAGNOSIS — F329 Major depressive disorder, single episode, unspecified: Secondary | ICD-10-CM

## 2012-08-31 DIAGNOSIS — F3289 Other specified depressive episodes: Secondary | ICD-10-CM

## 2012-08-31 DIAGNOSIS — F32A Depression, unspecified: Secondary | ICD-10-CM

## 2012-08-31 DIAGNOSIS — G4700 Insomnia, unspecified: Secondary | ICD-10-CM

## 2012-08-31 MED ORDER — PAROXETINE HCL 20 MG PO TABS: 20.0000 mg | ORAL_TABLET | Freq: Every day | ORAL | Status: DC

## 2012-08-31 NOTE — Patient Instructions (Addendum)
Increase paxil to 20mg  daily  Continue klonopin, try at bedtime Out of work Please bring FMLA papers Follow-up with psychiatrist  F/U 4 weeks
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
## 2012-08-31 NOTE — Assessment & Plan Note (Signed)
Unchanged, she is afraid to use both restoril and klonopin because of drowsiness, will have her use 1 tablet of klonopin at bedtime in stead

## 2012-08-31 NOTE — Assessment & Plan Note (Signed)
Deteriorated, she is having difficulty coping and now this is interfering with her work. She is both willing and advised to take some time off. She needs intense therapy/counseling and further medication adjustments Paxil increased to 20mg , continue klonopin

## 2012-09-10 ENCOUNTER — Telehealth: Payer: Self-pay | Admitting: Family Medicine

## 2012-09-10 ENCOUNTER — Other Ambulatory Visit: Payer: Self-pay | Admitting: Family Medicine

## 2012-09-10 DIAGNOSIS — F32A Depression, unspecified: Secondary | ICD-10-CM

## 2012-09-10 DIAGNOSIS — F329 Major depressive disorder, single episode, unspecified: Secondary | ICD-10-CM

## 2012-09-10 DIAGNOSIS — F419 Anxiety disorder, unspecified: Secondary | ICD-10-CM | POA: Insufficient documentation

## 2012-09-10 NOTE — Progress Notes (Signed)
Spoke with pt,she has not seen psychiatrist yet, having difficulty getting in. I will have my office call and get appt ASAP F/U 2 weeks with my office She is feeling somewhat better with medications and time off, still needs counseling

## 2012-09-10 NOTE — Telephone Encounter (Signed)
Papers are at the front desk already. Thanks for the update

## 2012-09-11 NOTE — Telephone Encounter (Signed)
Papers have been faxed over to [employer]

## 2012-09-22 ENCOUNTER — Telehealth: Payer: Self-pay | Admitting: Family Medicine

## 2012-09-29 ENCOUNTER — Ambulatory Visit (INDEPENDENT_AMBULATORY_CARE_PROVIDER_SITE_OTHER): Payer: 59 | Admitting: Family Medicine

## 2012-09-29 ENCOUNTER — Encounter: Payer: Self-pay | Admitting: Family Medicine

## 2012-09-29 VITALS — BP 104/68 | HR 91 | Resp 18 | Ht 66.0 in | Wt 225.1 lb

## 2012-09-29 DIAGNOSIS — F3289 Other specified depressive episodes: Secondary | ICD-10-CM

## 2012-09-29 DIAGNOSIS — F419 Anxiety disorder, unspecified: Secondary | ICD-10-CM

## 2012-09-29 DIAGNOSIS — F411 Generalized anxiety disorder: Secondary | ICD-10-CM

## 2012-09-29 DIAGNOSIS — F32A Depression, unspecified: Secondary | ICD-10-CM

## 2012-09-29 DIAGNOSIS — G4700 Insomnia, unspecified: Secondary | ICD-10-CM

## 2012-09-29 DIAGNOSIS — F329 Major depressive disorder, single episode, unspecified: Secondary | ICD-10-CM

## 2012-09-29 NOTE — Patient Instructions (Signed)
F/U 4 weeks  F/u with therapist and psychiatrist  Use temazepam at bedtime or Klonopin

## 2012-09-30 NOTE — Assessment & Plan Note (Signed)
Approx 20 minutes spent with pt > 50% spent on counseling and medication management I do not feel she is able to return to work, unfortunately we have had delays in getting her seen by therapy and psychiatrist, she will see therapist this week, We will try to get earlier appt for psychiatrist though overall she has improved with paxil at increased dose
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
## 2012-09-30 NOTE — Assessment & Plan Note (Signed)
Reminded to use sleeping aide, very fatigued, depression worse with lack of sleep

## 2012-09-30 NOTE — Progress Notes (Signed)
  Subjective:    Patient ID: Angie Powell, female    DOB: 1966/09/03, 46 y.o.   MRN: [id number]  HPI Pt here to f/u depression/ panic attacks, she is doing better on increased dose of paxil but has panic attacks that break through and she gets very overwhelmed and cries. She has been staying to herself, forcing her self to get up and do her daily activities with her daughter. She describes going into [employer] where she worked and having an immediate sense of fear and overwhelmed and had to leave the store and sit in car waiting for her daughter to purchase items. She is not sleeping well and often forgets to take the sleeping aide. She rarely uses clonazepam. When attacks occur she tries to breathe or lay down. Appt with therapist Thurs. Psychiatry appt December.    Review of Systems - per above  GEN- denies fatigue, fever, weight loss,weakness, recent illness HEENT- denies eye drainage, change in vision, nasal discharge, CVS- denies chest pain, palpitations RESP- denies SOB, cough, wheeze ABD- denies N/V, change in stools, abd pain GU- denies dysuria, hematuria, dribbling, incontinence MSK- denies joint pain, muscle aches, injury Neuro- denies headache, dizziness, syncope, seizure activity Psych- + anxiety, depression       Objective:   Physical Exam GEN- NAD, alert and oriented x3, well groomed, fatigued appearing Psych- Crying, poor eye contact, depressed affect, no hallucinations, normal speech  PHQ 9- 15  ( previous score 19)         Assessment & Plan:

## 2012-10-01 ENCOUNTER — Ambulatory Visit (HOSPITAL_COMMUNITY): Payer: 59 | Admitting: Psychiatry

## 2012-10-02 ENCOUNTER — Ambulatory Visit (HOSPITAL_COMMUNITY): Payer: 59 | Admitting: Psychiatry

## 2012-10-05 ENCOUNTER — Ambulatory Visit (INDEPENDENT_AMBULATORY_CARE_PROVIDER_SITE_OTHER): Payer: 59 | Admitting: Psychiatry

## 2012-10-05 ENCOUNTER — Encounter (HOSPITAL_COMMUNITY): Payer: Self-pay | Admitting: Psychiatry

## 2012-10-05 DIAGNOSIS — F329 Major depressive disorder, single episode, unspecified: Secondary | ICD-10-CM
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
## 2012-10-06 NOTE — Patient Instructions (Signed)
Discussed orally 

## 2012-10-12 ENCOUNTER — Telehealth (HOSPITAL_COMMUNITY): Payer: Self-pay | Admitting: Psychiatry

## 2012-10-12 NOTE — Telephone Encounter (Signed)
Therapist called and informed patient that therapist would not be able to continue seeing patient due to a conflict of interest.  Therapist referred patient to Dr. Kieth Brightly for services.  Patient agreed to schedule appointment with Dr. Kieth Brightly.

## 2012-10-13 ENCOUNTER — Encounter (HOSPITAL_COMMUNITY): Payer: Self-pay | Admitting: Psychiatry

## 2012-10-13 ENCOUNTER — Ambulatory Visit (INDEPENDENT_AMBULATORY_CARE_PROVIDER_SITE_OTHER): Payer: 59 | Admitting: Psychiatry

## 2012-10-13 VITALS — BP 134/88 | HR 68 | Ht 65.75 in | Wt 221.4 lb

## 2012-10-13 DIAGNOSIS — G4700 Insomnia, unspecified: Secondary | ICD-10-CM

## 2012-10-13 DIAGNOSIS — F332 Major depressive disorder, recurrent severe without psychotic features: Secondary | ICD-10-CM

## 2012-10-13 DIAGNOSIS — F329 Major depressive disorder, single episode, unspecified: Secondary | ICD-10-CM

## 2012-10-13 DIAGNOSIS — F419 Anxiety disorder, unspecified: Secondary | ICD-10-CM

## 2012-10-13 DIAGNOSIS — F411 Generalized anxiety disorder: Secondary | ICD-10-CM

## 2012-10-13 DIAGNOSIS — F32A Depression, unspecified: Secondary | ICD-10-CM

## 2012-10-13 MED ORDER — PAROXETINE HCL 30 MG PO TABS: 30.0000 mg | ORAL_TABLET | Freq: Every day | ORAL | Status: DC

## 2012-10-13 MED ORDER — TRAZODONE HCL 50 MG PO TABS: 50.0000 mg | ORAL_TABLET | Freq: Every day | ORAL | Status: DC

## 2012-10-13 NOTE — Patient Instructions (Signed)
"  [employer]" by Camelia Phenes could be very helpful.  Get into an exercise ROUTINE.   See what Trazodone by itself does for the trouble getting to and staying asleep.
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
## 2012-10-14 ENCOUNTER — Ambulatory Visit (HOSPITAL_COMMUNITY): Payer: 59 | Admitting: Psychiatry

## 2012-10-14 NOTE — Telephone Encounter (Signed)
Patient request routed to [facility].

## 2012-10-19 ENCOUNTER — Ambulatory Visit (HOSPITAL_COMMUNITY): Payer: 59 | Admitting: Psychiatry

## 2012-10-20 ENCOUNTER — Encounter (HOSPITAL_COMMUNITY): Payer: Self-pay | Admitting: Psychology

## 2012-10-20 ENCOUNTER — Ambulatory Visit (INDEPENDENT_AMBULATORY_CARE_PROVIDER_SITE_OTHER): Payer: 59 | Admitting: Psychology

## 2012-10-20 DIAGNOSIS — G4700 Insomnia, unspecified: Secondary | ICD-10-CM

## 2012-10-20 DIAGNOSIS — F329 Major depressive disorder, single episode, unspecified: Secondary | ICD-10-CM

## 2012-10-20 DIAGNOSIS — F411 Generalized anxiety disorder: Secondary | ICD-10-CM

## 2012-10-20 DIAGNOSIS — F419 Anxiety disorder, unspecified: Secondary | ICD-10-CM
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
## 2012-10-27 ENCOUNTER — Ambulatory Visit (INDEPENDENT_AMBULATORY_CARE_PROVIDER_SITE_OTHER): Payer: 59 | Admitting: Family Medicine

## 2012-10-27 ENCOUNTER — Encounter: Payer: Self-pay | Admitting: Family Medicine

## 2012-10-27 VITALS — BP 134/80 | HR 98 | Resp 18 | Ht 66.0 in | Wt 227.0 lb

## 2012-10-27 DIAGNOSIS — F32A Depression, unspecified: Secondary | ICD-10-CM

## 2012-10-27 DIAGNOSIS — F3289 Other specified depressive episodes: Secondary | ICD-10-CM

## 2012-10-27 DIAGNOSIS — F411 Generalized anxiety disorder: Secondary | ICD-10-CM

## 2012-10-27 DIAGNOSIS — G4700 Insomnia, unspecified: Secondary | ICD-10-CM

## 2012-10-27 DIAGNOSIS — F329 Major depressive disorder, single episode, unspecified: Secondary | ICD-10-CM

## 2012-10-27 DIAGNOSIS — F419 Anxiety disorder, unspecified: Secondary | ICD-10-CM

## 2012-10-27 NOTE — Patient Instructions (Addendum)
Follow Thursday January 2nd for mood Letter to be sent to work , with plan to start part-time on January 6th

## 2012-10-28 ENCOUNTER — Encounter: Payer: Self-pay | Admitting: Family Medicine

## 2012-10-28 NOTE — Assessment & Plan Note (Addendum)
She is improving and therapy is helping tremendously.  She was able to get through visit today without crying uncontrollably. Continue paxil, trazodone for sleep Anticipate return to work January 6th with reduced hours, letter will be sent

## 2012-10-28 NOTE — Assessment & Plan Note (Signed)
Per above, approx 15 minutes spent with patient > 50% of time spent on counseling

## 2012-10-28 NOTE — Assessment & Plan Note (Signed)
Now on trazodone, this helps when she takes it

## 2012-10-28 NOTE — Progress Notes (Signed)
  Subjective:    Patient ID: Angie Powell, female    DOB: Dec 20, 1965, 46 y.o.   MRN: [id number]  HPI Pt here for intermin visit for anxiety and depression, She is feeling somewhat better, feels therapy is helping. SHe is taking paxil and now trazodone for sleep. She continues to have episodes of panic, this AM had one, but started cleaning the house and this helped her take her mind off things. She has seen psychiatry as well for medication adjustments. She still gets overwhelmed when she goes into her [employer] store and has some anxiety about returning to work because of her many duties and feeling overwhelmed   Review of Systems - per above  GEN- denies fatigue, fever, weight loss,weakness, recent illness CVS- denies chest pain, palpitations Neuro- denies headache, dizziness, syncope, seizure activity Psych- +anxiet, depression      Objective:   Physical Exam GEN-NAD, well groomed, alert and oriented x 3 Psych- normal speech, good eye contact, smiling more, a little anxious appearing, not depressed appearing       Assessment & Plan:

## 2012-10-29 ENCOUNTER — Ambulatory Visit (HOSPITAL_COMMUNITY): Payer: 59 | Admitting: Psychiatry

## 2012-11-02 ENCOUNTER — Ambulatory Visit (HOSPITAL_COMMUNITY): Payer: Self-pay | Admitting: Psychology

## 2012-11-09 ENCOUNTER — Telehealth (HOSPITAL_COMMUNITY): Payer: Self-pay | Admitting: Psychology

## 2012-11-12 ENCOUNTER — Encounter: Payer: Self-pay | Admitting: Family Medicine

## 2012-11-12 ENCOUNTER — Other Ambulatory Visit: Payer: Self-pay

## 2012-11-12 ENCOUNTER — Ambulatory Visit (INDEPENDENT_AMBULATORY_CARE_PROVIDER_SITE_OTHER): Payer: 59 | Admitting: Family Medicine

## 2012-11-12 ENCOUNTER — Telehealth: Payer: Self-pay | Admitting: Family Medicine

## 2012-11-12 VITALS — BP 114/72 | HR 100 | Temp 98.8°F | Resp 18 | Ht 66.0 in | Wt 227.0 lb

## 2012-11-12 DIAGNOSIS — Z13220 Encounter for screening for lipoid disorders: Secondary | ICD-10-CM

## 2012-11-12 DIAGNOSIS — F329 Major depressive disorder, single episode, unspecified: Secondary | ICD-10-CM

## 2012-11-12 DIAGNOSIS — F419 Anxiety disorder, unspecified: Secondary | ICD-10-CM

## 2012-11-12 DIAGNOSIS — F3289 Other specified depressive episodes: Secondary | ICD-10-CM

## 2012-11-12 DIAGNOSIS — F32A Depression, unspecified: Secondary | ICD-10-CM

## 2012-11-12 DIAGNOSIS — F411 Generalized anxiety disorder: Secondary | ICD-10-CM

## 2012-11-12 DIAGNOSIS — J329 Chronic sinusitis, unspecified: Secondary | ICD-10-CM

## 2012-11-12 DIAGNOSIS — E669 Obesity, unspecified: Secondary | ICD-10-CM

## 2012-11-12 DIAGNOSIS — J019 Acute sinusitis, unspecified: Secondary | ICD-10-CM | POA: Insufficient documentation

## 2012-11-12 DIAGNOSIS — J45909 Unspecified asthma, uncomplicated: Secondary | ICD-10-CM

## 2012-11-12 MED ORDER — AMOXICILLIN-POT CLAVULANATE 200-28.5 MG PO CHEW: 1.0000 | CHEWABLE_TABLET | Freq: Two times a day (BID) | ORAL | Status: AC

## 2012-11-12 MED ORDER — AMOXICILLIN-POT CLAVULANATE 200-28.5 MG PO CHEW: 1.0000 | CHEWABLE_TABLET | Freq: Two times a day (BID) | ORAL | Status: DC

## 2012-11-12 MED ORDER — PREDNISONE 10 MG PO TABS: ORAL_TABLET | ORAL | Status: DC

## 2012-11-12 MED ORDER — ALBUTEROL SULFATE HFA 108 (90 BASE) MCG/ACT IN AERS: 2.0000 | INHALATION_SPRAY | RESPIRATORY_TRACT | Status: DC | PRN

## 2012-11-12 NOTE — Patient Instructions (Addendum)
Start antibiotics, prednisone Use the albuterol as needed Call about the allergy shot  Return to work Monday at reduced schedule  F/U 4 weeks

## 2012-11-12 NOTE — Assessment & Plan Note (Signed)
Will plan to restart work on Monday at reduced hours. She will continue to followup with psychiatry as this will be a trial period to see how she will do mentally when she is back in the atmosphere

## 2012-11-12 NOTE — Assessment & Plan Note (Signed)
Antibiotics given 

## 2012-11-12 NOTE — Assessment & Plan Note (Signed)
Asthma with bronchitis currently. I will give her prednisone as well as antibiotics and also cover the sinusitis. She will use Advair and use the albuterol inhaler as needed every 4-6 hours

## 2012-11-12 NOTE — Progress Notes (Signed)
  Subjective:    Patient ID: Angie Powell, female    DOB: Jan 17, 1966, 47 y.o.   MRN: [id number]  HPI  Patient presents with sinus drainage and congestion for the past 10 days. She's had some cough with production and has used her Advair however has not used the albuterol as this has expired. She's had a few episodes of wheezing and has severe allergies which she is getting allergy shots for once a week. She's been taking Mucinex, Claritin-D over-the-counter. Positive sick contact with her daughter  Mentally she is doing okay she still has some stresses she is to followup with psychiatry in 2 weeks. She is ready to return at reduced hours on Monday and we will see how her anxiety fares.  Review of Systems GEN- denies fatigue, fever, weight loss,weakness, recent illness HEENT- denies eye drainage, change in vision,+ nasal discharge, CVS- denies chest pain, palpitations RESP- denies SOB,+ cough, wheeze ABD- denies N/V, change in stools, abd pain GU- denies dysuria, hematuria, dribbling, incontinence MSK- denies joint pain, muscle aches, injury Neuro- denies headache, dizziness, syncope, seizure activity       Objective:   Physical Exam  GEN- NAD, alert and oriented x3 HEENT- PERRL, EOMI, non injected sclera, pink conjunctiva, MMM, oropharynx non injected, Right TM mild erythema good light reflex no effusion, Left TM clear  + maxillary sinus tenderness, inflammed turbinates,  Nasal drainage  Neck- Supple, +shotty anterior LAD CVS- RRR, no murmur RESP-CTAB, no wheeze  EXT- No edema Pulses- Radial  Psych- not anxious appearing, flat affect today, No crying        Assessment & Plan:

## 2012-11-12 NOTE — Assessment & Plan Note (Signed)
Per above no change the medications

## 2012-11-12 NOTE — Assessment & Plan Note (Signed)
Reviewed her medical history she has not seen her PCP since he has been following with me for her depression and anxiety. She is due for repeat fasting lipid panel she's had her GYN exam done in [place] with Dr. Marcelle Overlie

## 2012-11-13 ENCOUNTER — Telehealth: Payer: Self-pay | Admitting: Family Medicine

## 2012-11-13 NOTE — Telephone Encounter (Signed)
Angie Powell spoke with patient

## 2012-11-13 NOTE — Telephone Encounter (Signed)
Letter to be faxed to [employer] benefits.  Fax # [phone]

## 2012-11-13 NOTE — Telephone Encounter (Signed)
Tried to call back and number not correct

## 2012-11-16 ENCOUNTER — Telehealth (HOSPITAL_COMMUNITY): Payer: Self-pay | Admitting: Psychology

## 2012-11-24 ENCOUNTER — Ambulatory Visit (INDEPENDENT_AMBULATORY_CARE_PROVIDER_SITE_OTHER): Payer: 59 | Admitting: Psychiatry

## 2012-11-24 ENCOUNTER — Encounter (HOSPITAL_COMMUNITY): Payer: Self-pay | Admitting: Psychiatry

## 2012-11-24 VITALS — Wt 224.6 lb

## 2012-11-24 DIAGNOSIS — E559 Vitamin D deficiency, unspecified: Secondary | ICD-10-CM

## 2012-11-24 DIAGNOSIS — F419 Anxiety disorder, unspecified: Secondary | ICD-10-CM

## 2012-11-24 DIAGNOSIS — J329 Chronic sinusitis, unspecified: Secondary | ICD-10-CM

## 2012-11-24 DIAGNOSIS — G4700 Insomnia, unspecified: Secondary | ICD-10-CM

## 2012-11-24 DIAGNOSIS — F5105 Insomnia due to other mental disorder: Secondary | ICD-10-CM

## 2012-11-24 DIAGNOSIS — F411 Generalized anxiety disorder: Secondary | ICD-10-CM

## 2012-11-24 DIAGNOSIS — F32A Depression, unspecified: Secondary | ICD-10-CM

## 2012-11-24 DIAGNOSIS — F332 Major depressive disorder, recurrent severe without psychotic features: Secondary | ICD-10-CM

## 2012-11-24 DIAGNOSIS — F418 Other specified anxiety disorders: Secondary | ICD-10-CM

## 2012-11-24 DIAGNOSIS — F329 Major depressive disorder, single episode, unspecified: Secondary | ICD-10-CM

## 2012-11-24 LAB — COMPREHENSIVE METABOLIC PANEL
ALT: 13 U/L (ref 0–35)
Albumin: 4.1 g/dL (ref 3.5–5.2)
CO2: 30 mEq/L (ref 19–32)
Chloride: 104 mEq/L (ref 96–112)
Glucose, Bld: 86 mg/dL (ref 70–99)
Potassium: 4.8 mEq/L (ref 3.5–5.3)
Sodium: 140 mEq/L (ref 135–145)
Total Protein: 6.9 g/dL (ref 6.0–8.3)

## 2012-11-24 LAB — LIPID PANEL
Cholesterol: 166 mg/dL (ref 0–200)
LDL Cholesterol: 98 mg/dL (ref 0–99)
VLDL: 16 mg/dL (ref 0–40)

## 2012-11-24 LAB — CBC
Hemoglobin: 13.3 g/dL (ref 12.0–15.0)
Platelets: 356 10*3/uL (ref 150–400)
RBC: 4.65 MIL/uL (ref 3.87–5.11)
WBC: 7.9 10*3/uL (ref 4.0–10.5)

## 2012-11-24 MED ORDER — TRAZODONE HCL 50 MG PO TABS: 50.0000 mg | ORAL_TABLET | Freq: Every day | ORAL | Status: DC

## 2012-11-24 MED ORDER — PAROXETINE HCL 30 MG PO TABS: 30.0000 mg | ORAL_TABLET | Freq: Every day | ORAL | Status: DC

## 2012-11-24 MED ORDER — METHOCARBAMOL 500 MG PO TABS: 500.0000 mg | ORAL_TABLET | Freq: Four times a day (QID) | ORAL | Status: DC

## 2012-11-24 NOTE — Patient Instructions (Addendum)
Yoga is a very helpful exercise method.  On TV or on line Gaiam is a source of high quality information about yoga and videos on yoga.  Renee Ramus is the world's number one video yoga instructor according to some experts.  There are exceptional health benefits that can be achieved through yoga.  The main principles of yoga is acceptance, no competition, no comparison, and no judgement.  It is exceptional in helping people meditate and get to a very relaxed state.   Positions that may be helpful for relaxing your back include monkey, plank and side plank.    Get Vitamin D level.  TRY YOGA BEFORE TRYING THE ROBAXIN
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
## 2012-11-25 ENCOUNTER — Ambulatory Visit (INDEPENDENT_AMBULATORY_CARE_PROVIDER_SITE_OTHER): Payer: 59 | Admitting: Psychology

## 2012-11-25 ENCOUNTER — Encounter (HOSPITAL_COMMUNITY): Payer: Self-pay | Admitting: Psychology

## 2012-11-25 DIAGNOSIS — F419 Anxiety disorder, unspecified: Secondary | ICD-10-CM

## 2012-11-25 DIAGNOSIS — F329 Major depressive disorder, single episode, unspecified: Secondary | ICD-10-CM

## 2012-11-25 DIAGNOSIS — G4700 Insomnia, unspecified: Secondary | ICD-10-CM

## 2012-11-25 DIAGNOSIS — F411 Generalized anxiety disorder: Secondary | ICD-10-CM

## 2012-11-25 LAB — VITAMIN D 25 HYDROXY (VIT D DEFICIENCY, FRACTURES): Vit D, 25-Hydroxy: 20 ng/mL — ABNORMAL LOW (ref 30–89)
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
## 2012-12-10 ENCOUNTER — Ambulatory Visit: Payer: 59 | Admitting: Family Medicine

## 2013-01-05 ENCOUNTER — Encounter (HOSPITAL_COMMUNITY): Payer: Self-pay | Admitting: Psychiatry

## 2013-01-05 NOTE — Progress Notes (Signed)
This encounter was created in error - please disregard.

## 2013-06-10 ENCOUNTER — Emergency Department: Payer: Self-pay | Admitting: Internal Medicine

## 2013-06-10 LAB — URINALYSIS, COMPLETE
Bacteria: NONE SEEN
Bilirubin,UR: NEGATIVE
Ketone: NEGATIVE
Ph: 6 (ref 4.5–8.0)
Protein: NEGATIVE
Specific Gravity: 1.02 (ref 1.003–1.030)
WBC UR: 1 /HPF (ref 0–5)

## 2013-06-10 LAB — CBC
MCHC: 33.8 g/dL (ref 32.0–36.0)
MCV: 88 fL (ref 80–100)
RBC: 4.41 10*6/uL (ref 3.80–5.20)
RDW: 12.9 % (ref 11.5–14.5)

## 2013-06-10 LAB — COMPREHENSIVE METABOLIC PANEL
Albumin: 3.4 g/dL (ref 3.4–5.0)
Alkaline Phosphatase: 81 U/L (ref 50–136)
BUN: 12 mg/dL (ref 7–18)
Bilirubin,Total: 0.4 mg/dL (ref 0.2–1.0)
Calcium, Total: 8.7 mg/dL (ref 8.5–10.1)
Chloride: 106 mmol/L (ref 98–107)
Co2: 29 mmol/L (ref 21–32)
Creatinine: 0.87 mg/dL (ref 0.60–1.30)
Osmolality: 274 (ref 275–301)
Potassium: 3.9 mmol/L (ref 3.5–5.1)
SGPT (ALT): 22 U/L (ref 12–78)
Sodium: 137 mmol/L (ref 136–145)
Total Protein: 7.1 g/dL (ref 6.4–8.2)

## 2013-06-10 LAB — TROPONIN I: Troponin-I: <0.02 ng/mL

## 2013-06-25 ENCOUNTER — Encounter: Payer: Self-pay | Admitting: Family Medicine

## 2013-06-25 ENCOUNTER — Ambulatory Visit (INDEPENDENT_AMBULATORY_CARE_PROVIDER_SITE_OTHER): Payer: 59 | Admitting: Family Medicine

## 2013-06-25 VITALS — BP 130/80 | HR 68 | Temp 98.1°F | Resp 18 | Wt 228.0 lb

## 2013-06-25 DIAGNOSIS — F32A Depression, unspecified: Secondary | ICD-10-CM

## 2013-06-25 DIAGNOSIS — G4700 Insomnia, unspecified: Secondary | ICD-10-CM

## 2013-06-25 DIAGNOSIS — F418 Other specified anxiety disorders: Secondary | ICD-10-CM

## 2013-06-25 DIAGNOSIS — F5105 Insomnia due to other mental disorder: Secondary | ICD-10-CM

## 2013-06-25 DIAGNOSIS — F489 Nonpsychotic mental disorder, unspecified: Secondary | ICD-10-CM

## 2013-06-25 DIAGNOSIS — F329 Major depressive disorder, single episode, unspecified: Secondary | ICD-10-CM

## 2013-06-25 DIAGNOSIS — F339 Major depressive disorder, recurrent, unspecified: Secondary | ICD-10-CM

## 2013-06-25 DIAGNOSIS — F411 Generalized anxiety disorder: Secondary | ICD-10-CM

## 2013-06-25 DIAGNOSIS — F341 Dysthymic disorder: Secondary | ICD-10-CM

## 2013-06-25 MED ORDER — PAROXETINE HCL 20 MG PO TABS: 20.0000 mg | ORAL_TABLET | ORAL | Status: DC

## 2013-06-25 MED ORDER — CLONAZEPAM 0.5 MG PO TABS: 0.5000 mg | ORAL_TABLET | Freq: Two times a day (BID) | ORAL | Status: DC | PRN

## 2013-06-25 MED ORDER — TRAZODONE HCL 50 MG PO TABS: 50.0000 mg | ORAL_TABLET | Freq: Every day | ORAL | Status: DC

## 2013-06-25 NOTE — Patient Instructions (Addendum)
Start paxil Call therapy - Tamera Punt [phone] Try the trazodone for sleep  F/U 1 week

## 2013-06-27 ENCOUNTER — Encounter: Payer: Self-pay | Admitting: Family Medicine

## 2013-06-27 DIAGNOSIS — F411 Generalized anxiety disorder: Secondary | ICD-10-CM | POA: Insufficient documentation

## 2013-06-27 DIAGNOSIS — F339 Major depressive disorder, recurrent, unspecified: Secondary | ICD-10-CM | POA: Insufficient documentation

## 2013-06-27 NOTE — Assessment & Plan Note (Signed)
Restart trazodone
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
## 2013-06-27 NOTE — Assessment & Plan Note (Addendum)
Recurrent episode, she has a therapist she will contact this week We will start paxil 20mg , she did do very well on this Meds for sleep given RTC 1 week If she requires time out of work will need repeat evaluation by psychiatry to sustain her FMLA Discussed importance of not stopping meds without guidance Also discussed increasing activity in the church which she states rejuvenates her  Approx 30 minutes spent with pt > 50% spent on counseling and medication management

## 2013-06-27 NOTE — Assessment & Plan Note (Signed)
Restart paxil, prn klonopin Therapy  F/u 1 week

## 2013-06-28 ENCOUNTER — Telehealth: Payer: Self-pay | Admitting: Family Medicine

## 2013-06-29 NOTE — Telephone Encounter (Signed)
Patient aware and appt made 

## 2013-06-29 NOTE — Telephone Encounter (Signed)
Have her bring the FMLA papers with her  Change appt to Tomorrow   She is being seen for Severe Depression and Anxiety

## 2013-06-29 NOTE — Telephone Encounter (Signed)
Pt thinks that she does need to be out of work and wanted to talk to you about it. She stated that she did speak to you about it at her LOV. She has an appt Friday but is willing to come in sooner if you need her to.

## 2013-06-30 ENCOUNTER — Ambulatory Visit (INDEPENDENT_AMBULATORY_CARE_PROVIDER_SITE_OTHER): Payer: 59 | Admitting: Family Medicine

## 2013-06-30 ENCOUNTER — Encounter: Payer: Self-pay | Admitting: Family Medicine

## 2013-06-30 VITALS — BP 130/60 | HR 82 | Temp 98.0°F | Resp 18 | Ht 65.0 in | Wt 224.0 lb

## 2013-06-30 DIAGNOSIS — F339 Major depressive disorder, recurrent, unspecified: Secondary | ICD-10-CM

## 2013-06-30 DIAGNOSIS — F411 Generalized anxiety disorder: Secondary | ICD-10-CM

## 2013-06-30 NOTE — Patient Instructions (Signed)
Continue current medication Take 1/2 tablet of the klonopin and see how you do with this F/U in 2 weeks

## 2013-07-01 ENCOUNTER — Encounter: Payer: Self-pay | Admitting: Family Medicine

## 2013-07-01 NOTE — Assessment & Plan Note (Signed)
Worsening anxiety/panic attacks adivsed to take klonopin at at times of attacks

## 2013-07-01 NOTE — Assessment & Plan Note (Signed)
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

## 2013-07-01 NOTE — Progress Notes (Signed)
  Subjective:    Patient ID: Angie Powell, female    DOB: [birth year], 47 y.o.   MRN: [id number]  HPI  Pt here to f/u depression and anxiety, seen last week, restarted on prozac, klonopin and trazodone for sleep. SHe attempted to go to work on Saturday, she walked in and began having panic attack, felt the rows of the sore closing in on her, felt like she couldn't breathe. She then returned to work today, has there fore approx 3 hours when she had two episodes where she felt herself panicking and then she ran to restroom and vomited. The second time she was just nauseas, She had been trying to get more work in since she was behind and had a lot of jobs to do. She was unable to concentrate all morning and finally left work and came in for appt today. SHe has been crying non stop throughout the day. She does not think she can handle work at this time.  Review of Systems - per above     Objective:   Physical Exam GEN-NAD, alert and oriented x3 Psych- well groomed, depressed affect, crying, anxious, poor concentration, no hallucinations, normal speech, fair eye contact. Denies SI       Assessment & Plan:

## 2013-07-02 ENCOUNTER — Ambulatory Visit: Payer: 59 | Admitting: Family Medicine

## 2013-07-05 ENCOUNTER — Telehealth: Payer: Self-pay | Admitting: Family Medicine

## 2013-07-05 NOTE — Telephone Encounter (Signed)
noted 

## 2013-07-14 ENCOUNTER — Encounter: Payer: Self-pay | Admitting: Family Medicine

## 2013-07-14 ENCOUNTER — Ambulatory Visit (INDEPENDENT_AMBULATORY_CARE_PROVIDER_SITE_OTHER): Payer: 59 | Admitting: Family Medicine

## 2013-07-14 VITALS — BP 110/72 | HR 88 | Temp 100.3°F | Resp 20 | Wt 228.0 lb

## 2013-07-14 DIAGNOSIS — F411 Generalized anxiety disorder: Secondary | ICD-10-CM

## 2013-07-14 DIAGNOSIS — B349 Viral infection, unspecified: Secondary | ICD-10-CM | POA: Insufficient documentation

## 2013-07-14 DIAGNOSIS — B9789 Other viral agents as the cause of diseases classified elsewhere: Secondary | ICD-10-CM

## 2013-07-14 DIAGNOSIS — F339 Major depressive disorder, recurrent, unspecified: Secondary | ICD-10-CM

## 2013-07-14 NOTE — Progress Notes (Signed)
  Subjective:    Patient ID: Angie Powell, female    DOB: January 18, 1966, 47 y.o.   MRN: [id number]  HPI  Pt here to f/u depression. Seen by psychiatry Dr. Tomasa [facility]- [facility], changed to paxil 30mg , she is to take 1/ 2 tablet and then increase to 30mg  in 1 week. Sleep has improved some. Continues to have overwhelming spells and anxiety. Worse when she thinks about her job and duties at [employer]. 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   Right ear pain, mild sinus pressure and cough for past few days. Was seen at Dentist Friday after tooth pain, had deep cleaning, given script for Penicillin by dentist. Did not start because she felt better.   Review of Systems - per above   GEN- denies fatigue, fever, weight loss,weakness, recent illness HEENT- denies eye drainage, change in vision, nasal discharge, CVS- denies chest pain, palpitations RESP- denies SOB, cough, wheezey Neuro- denies headache, dizziness, syncope, seizure activity      Objective:   Physical Exam  GEN- NAD, alert and oriented x3, low grade fever HEENT- PERRL, EOMI, non injected sclera, pink conjunctiva, MMM, oropharynx clear, no abscess seen TM clear bilat no effusion, no maxillary sinus tenderness, nares clear rhinorrhea  Neck- Supple, shotty LAD CVS- RRR, no murmur RESP-CTAB EXT- No edema Pulses- Radial 2+ Psych- flat affect, tearful at times, not anxious appearing, well groomed         Assessment & Plan:

## 2013-07-14 NOTE — Assessment & Plan Note (Signed)
I think her symptoms are more consistent with viral illness However with recent trip to dentist and cleaning advised to take antibiotics per dentist instructions

## 2013-07-14 NOTE — Assessment & Plan Note (Signed)
Continues to have some anxiety/panic attacks therapy will help

## 2013-07-14 NOTE — Patient Instructions (Signed)
Start the penicillin given by the dentist Continue all other medication Return to work on Sept 15th F/U 8 weeks

## 2013-07-14 NOTE — Assessment & Plan Note (Signed)
Pt seen by psychiatry, medications adjusted Return to work on 9/15 She is rescheduling with therapist Needs to concentrate on more relaxation

## 2013-07-15 ENCOUNTER — Emergency Department: Payer: Self-pay | Admitting: Emergency Medicine

## 2013-07-16 ENCOUNTER — Telehealth: Payer: Self-pay | Admitting: Family Medicine

## 2013-07-16 NOTE — Telephone Encounter (Signed)
Still sick from Wednesday.  Still with fever and sinus head pain.  Is stating does remember hitting right side of head last week( Thursday).  She says that is where most of her pain is??  Does she need stronger antibiotic or do you want CT of head??  That is her question.  Please advise.

## 2013-07-16 NOTE — Telephone Encounter (Signed)
I would continue the penicillin Take tylenol, she can also get mucinex for sinus relief Push fluids No CT scan needed at this time

## 2013-07-16 NOTE — Telephone Encounter (Signed)
Pt called. Told continue penicillin, Can also take Tylenol and Mucinex.  Be sure you are pushing po fluids.  Let doctor know how you are feeling Monday

## 2013-07-21 ENCOUNTER — Ambulatory Visit
Admission: RE | Admit: 2013-07-21 | Discharge: 2013-07-21 | Disposition: A | Discharge status: Home / Self Care | Payer: 59 | Source: Ambulatory Visit | Attending: Allergy and Immunology | Admitting: Allergy and Immunology

## 2013-07-21 ENCOUNTER — Other Ambulatory Visit: Payer: Self-pay | Admitting: Allergy and Immunology

## 2013-07-21 DIAGNOSIS — J019 Acute sinusitis, unspecified: Secondary | ICD-10-CM

## 2013-09-06 ENCOUNTER — Ambulatory Visit: Payer: 59 | Admitting: Family Medicine

## 2014-04-12 ENCOUNTER — Ambulatory Visit: Payer: 59 | Admitting: Family Medicine

## 2015-01-16 ENCOUNTER — Ambulatory Visit (INDEPENDENT_AMBULATORY_CARE_PROVIDER_SITE_OTHER): Payer: BLUE CROSS/BLUE SHIELD | Admitting: Family Medicine

## 2015-01-16 ENCOUNTER — Encounter: Payer: Self-pay | Admitting: Family Medicine

## 2015-01-16 VITALS — BP 126/80 | HR 78 | Temp 98.4°F | Resp 16 | Ht 66.0 in | Wt 233.0 lb

## 2015-01-16 DIAGNOSIS — K219 Gastro-esophageal reflux disease without esophagitis: Secondary | ICD-10-CM | POA: Diagnosis not present

## 2015-01-16 DIAGNOSIS — Z0000 Encounter for general adult medical examination without abnormal findings: Secondary | ICD-10-CM

## 2015-01-16 DIAGNOSIS — E041 Nontoxic single thyroid nodule: Secondary | ICD-10-CM

## 2015-01-16 DIAGNOSIS — J4530 Mild persistent asthma, uncomplicated: Secondary | ICD-10-CM | POA: Diagnosis not present

## 2015-01-16 DIAGNOSIS — E559 Vitamin D deficiency, unspecified: Secondary | ICD-10-CM

## 2015-01-16 DIAGNOSIS — E669 Obesity, unspecified: Secondary | ICD-10-CM

## 2015-01-16 LAB — LIPID PANEL
Cholesterol: 162 mg/dL (ref 0–200)
HDL: 49 mg/dL (ref >=46)
LDL CALC: 94 mg/dL (ref 0–99)
TRIGLYCERIDES: 96 mg/dL (ref <150)
Total CHOL/HDL Ratio: 3.3 Ratio
VLDL: 19 mg/dL (ref 0–40)

## 2015-01-16 LAB — CBC WITH DIFFERENTIAL/PLATELET
BASOS ABS: 0.1 10*3/uL (ref 0.0–0.1)
Basophils Relative: 1 % (ref 0–1)
EOS ABS: 0.1 10*3/uL (ref 0.0–0.7)
EOS PCT: 2 % (ref 0–5)
HCT: 40.2 % (ref 36.0–46.0)
HEMOGLOBIN: 12.8 g/dL (ref 12.0–15.0)
LYMPHS PCT: 39 % (ref 12–46)
Lymphs Abs: 2.4 10*3/uL (ref 0.7–4.0)
MCH: 28.4 pg (ref 26.0–34.0)
MCHC: 31.8 g/dL (ref 30.0–36.0)
MCV: 89.1 fL (ref 78.0–100.0)
MONO ABS: 0.5 10*3/uL (ref 0.1–1.0)
MPV: 10.3 fL (ref 8.6–12.4)
Monocytes Relative: 8 % (ref 3–12)
Neutro Abs: 3.1 10*3/uL (ref 1.7–7.7)
Neutrophils Relative %: 50 % (ref 43–77)
PLATELETS: 341 10*3/uL (ref 150–400)
RBC: 4.51 MIL/uL (ref 3.87–5.11)
RDW: 13.3 % (ref 11.5–15.5)
WBC: 6.1 10*3/uL (ref 4.0–10.5)

## 2015-01-16 LAB — COMPREHENSIVE METABOLIC PANEL
ALK PHOS: 76 U/L (ref 39–117)
ALT: 14 U/L (ref 0–35)
AST: 17 U/L (ref 0–37)
Albumin: 3.9 g/dL (ref 3.5–5.2)
BILIRUBIN TOTAL: 0.5 mg/dL (ref 0.2–1.2)
BUN: 10 mg/dL (ref 6–23)
CO2: 25 mEq/L (ref 19–32)
Calcium: 9.1 mg/dL (ref 8.4–10.5)
Chloride: 102 mEq/L (ref 96–112)
Creat: 0.86 mg/dL (ref 0.50–1.10)
Glucose, Bld: 79 mg/dL (ref 70–99)
POTASSIUM: 4.4 meq/L (ref 3.5–5.3)
SODIUM: 135 meq/L (ref 135–145)
Total Protein: 6.6 g/dL (ref 6.0–8.3)

## 2015-01-16 LAB — TSH: TSH: 1.297 u[IU]/mL (ref 0.350–4.500)

## 2015-01-16 MED ORDER — ALBUTEROL SULFATE HFA 108 (90 BASE) MCG/ACT IN AERS: 2.0000 | INHALATION_SPRAY | RESPIRATORY_TRACT | Status: DC | PRN

## 2015-01-16 NOTE — Assessment & Plan Note (Signed)
Followed by allergies, meds are PRN, albuterol inhaler refilled today

## 2015-01-16 NOTE — Progress Notes (Signed)
Patient ID: Angie Powell, female   DOB: [birth year], 49 y.o.   MRN: [id number]     Subjective:    Patient ID: Angie Powell, female    DOB: [birth year], 49 y.o.   MRN: [id number]  Patient presents for CPE- no PAP  Asian here for complete physical exam. She has no specific concerns today. She's only been followed by her GYN she recently had a mammogram and a Pap smear. She is also seeing her allergist. She is overdue for thyroid ultrasound she has history of thyroid nodule which was biopsied in the past she's also due for fasting labs. Her medications were reviewed and updated. Immunizations are up-to-date colonoscopy up-to-date She recently restarted at the gym she has a goal to lose at least 20 or 30 pounds of weight. She has moved onto a different job and it has been less stressful she's not had any problems with her mood over the past year    Review Of Systems:  GEN- denies fatigue, fever, weight loss,weakness, recent illness HEENT- denies eye drainage, change in vision, nasal discharge, CVS- denies chest pain, palpitations RESP- denies SOB, cough, wheeze ABD- denies N/V, change in stools, abd pain GU- denies dysuria, hematuria, dribbling, incontinence MSK- denies joint pain, muscle aches, injury Neuro- denies headache, dizziness, syncope, seizure activity       Objective:    BP 126/80 mmHg  Pulse 78  Temp(Src) 98.4 F (36.9 C) (Oral)  Resp 16  Ht 5\' 6"  (1.676 m)  Wt 233 lb (105.688 kg)  BMI 37.63 kg/m2  LMP 01/09/2015 (Approximate) GEN- NAD, alert and oriented x3 HEENT- PERRL, EOMI, non injected sclera, pink conjunctiva, MMM, oropharynx clear, TM Clear bilat Neck- Supple, no thyromegaly, small left nodule palpated, ? Small right lower nodule CVS- RRR, no murmur RESP-CTAB ABD-NABS,soft,NT,ND EXT- No edema Pulses- Radial, DP- 2+        Assessment & Plan:      Problem List Items Addressed This Visit      Unprioritized   Vitamin D deficiency (Chronic)   Relevant Orders   Vitamin D, 25-hydroxy   Thyroid nodule   Relevant Orders   TSH   GERD   Asthma   Relevant Medications   beclomethasone (QVAR) 40 MCG/ACT inhaler    Other Visit Diagnoses    Routine general medical examination at a health care facility    -  Primary    CPE done, obtain GYN records, shots UTD, fasting labs today    Relevant Orders    CBC with Differential/Platelet    Comprehensive metabolic panel    Lipid panel       Note: This dictation was prepared with Dragon dictation along with smaller phrase technology. Any transcriptional errors that result from this process are unintentional.

## 2015-01-16 NOTE — Assessment & Plan Note (Signed)
Check TFT, Thyroid US to be done

## 2015-01-16 NOTE — Assessment & Plan Note (Signed)
Discussed weight loss, healthy eating Increase water, meal prepping for snacks Low fat diet Short term goals set

## 2015-01-16 NOTE — Patient Instructions (Addendum)
Release of records- [facility]- Last PAP SMEAR /MAMMOGRAM/LABS ONLY I recommend eye visit once a year I recommend dental visit every 6 months Goal is to  Exercise 30 minutes 5 days a week We will send a letter with lab results  [place] of thyroid to be set up F/U as needed or in 1 year

## 2015-01-16 NOTE — Assessment & Plan Note (Signed)
OTC Zegerid

## 2015-01-17 ENCOUNTER — Telehealth: Payer: Self-pay | Admitting: *Deleted

## 2015-01-17 ENCOUNTER — Other Ambulatory Visit: Payer: Self-pay | Admitting: *Deleted

## 2015-01-17 LAB — VITAMIN D 25 HYDROXY (VIT D DEFICIENCY, FRACTURES): VIT D 25 HYDROXY: 8 ng/mL — AB (ref 30–100)

## 2015-01-17 MED ORDER — VITAMIN D (ERGOCALCIFEROL) 1.25 MG (50000 UNIT) PO CAPS: 50000.0000 [IU] | ORAL_CAPSULE | ORAL | Status: DC

## 2015-01-17 NOTE — Telephone Encounter (Signed)
Pt has appt on Thursday March 10 at 2:15pm pt needs to arrive at 2:00 at [place] [facility]. Lmtrc x2 for appt

## 2015-01-19 ENCOUNTER — Ambulatory Visit
Admission: RE | Admit: 2015-01-19 | Discharge: 2015-01-19 | Disposition: A | Discharge status: Home / Self Care | Payer: BLUE CROSS/BLUE SHIELD | Source: Ambulatory Visit | Attending: Family Medicine | Admitting: Family Medicine

## 2015-01-19 DIAGNOSIS — E041 Nontoxic single thyroid nodule: Secondary | ICD-10-CM

## 2015-01-25 ENCOUNTER — Other Ambulatory Visit: Payer: Self-pay | Admitting: Family Medicine

## 2015-01-25 DIAGNOSIS — E041 Nontoxic single thyroid nodule: Secondary | ICD-10-CM

## 2015-04-11 ENCOUNTER — Other Ambulatory Visit: Payer: Self-pay | Admitting: Obstetrics and Gynecology

## 2015-04-12 LAB — CYTOLOGY - PAP

## 2015-06-06 ENCOUNTER — Ambulatory Visit (INDEPENDENT_AMBULATORY_CARE_PROVIDER_SITE_OTHER): Payer: BLUE CROSS/BLUE SHIELD | Admitting: Family Medicine

## 2015-06-06 ENCOUNTER — Encounter: Payer: Self-pay | Admitting: Family Medicine

## 2015-06-06 VITALS — BP 134/78 | HR 82 | Temp 99.0°F | Resp 14 | Ht 66.0 in | Wt 235.0 lb

## 2015-06-06 DIAGNOSIS — J0100 Acute maxillary sinusitis, unspecified: Secondary | ICD-10-CM

## 2015-06-06 MED ORDER — LEVOFLOXACIN 750 MG PO TABS: 750.0000 mg | ORAL_TABLET | Freq: Every day | ORAL | Status: DC

## 2015-06-06 MED ORDER — METHYLPREDNISOLONE 4 MG PO TBPK: ORAL_TABLET | ORAL | Status: DC

## 2015-06-06 NOTE — Patient Instructions (Signed)
Take antibiotics Start steroid dosepak  F/U as needed

## 2015-06-06 NOTE — Progress Notes (Signed)
Patient ID: Angie Powell, female   DOB: 06/09/1966, 49 y.o.   MRN: [id number]   Subjective:    Patient ID: Angie Powell, female    DOB: 11/01/1966, 49 y.o.   MRN: [id number]  Patient presents for Illness  patient with recurrent sinusitis. She actually had symptoms back in April after she went on a trip she was treated by her allergy and asthma doctor she was given Z-Pak and prednisone shot she cleared up however her symptoms returned after she returned from a trip to [place] last month. She was seen at an urgent care they also gave her Z-Pak but her symptoms never completely cleared. She still has pain mostly on the right side of her face above her eye and in the cheek area. She also gets drainage. She has history of chronic sinusitis as well as asthma and allergies. She is using all of her nasal medications as well as her oral anti-histamines. She is also decongest for a few days time with Sudafed    Review Of Systems:  GEN- denies fatigue, fever, weight loss,weakness, recent illness HEENT- denies eye drainage, change in vision, +nasal discharge, CVS- denies chest pain, palpitations RESP- denies SOB, cough, wheeze ABD- denies N/V, change in stools, abd pain GU- denies dysuria, hematuria, dribbling, incontinence MSK- denies joint pain, muscle aches, injury Neuro- denies headache, dizziness, syncope, seizure activity       Objective:    BP 134/78 mmHg  Pulse 82  Temp(Src) 99 F (37.2 C) (Oral)  Resp 14  Ht 5\' 6"  (1.676 m)  Wt 235 lb (106.595 kg)  BMI 37.95 kg/m2  LMP 05/22/2015 (Approximate) GEN- NAD, alert and oriented x3 HEENT- PERRL, EOMI, non injected sclera, pink conjunctiva, MMM, oropharynx mild injection, TM clear bilat no effusion,  + maxillary sinus tenderness, inflammed turbinates,  Nasal drainage  Neck- Supple, no LAD CVS- RRR, no murmur RESP-CTAB EXT- No edema Pulses- Radial 2+         Assessment & Plan:      Problem List Items Addressed This  Visit    None    Visit Diagnoses    Acute maxillary sinusitis, recurrence not specified    -  Primary    Levaquin, try oral steroids, if this persist, recommend she see her ENT for sinus evaluation, has had CT max face showing chronic sinusitis, continue nasal and allergy meds    Relevant Medications    levofloxacin (LEVAQUIN) 750 MG tablet    methylPREDNISolone (MEDROL DOSEPAK) 4 MG TBPK tablet       Note: This dictation was prepared with Dragon dictation along with smaller phrase technology. Any transcriptional errors that result from this process are unintentional.

## 2015-08-08 ENCOUNTER — Ambulatory Visit: Payer: BLUE CROSS/BLUE SHIELD | Admitting: Neurology

## 2015-08-08 ENCOUNTER — Encounter: Payer: Self-pay | Admitting: *Deleted

## 2015-08-08 ENCOUNTER — Telehealth: Payer: Self-pay | Admitting: *Deleted

## 2015-08-08 NOTE — Telephone Encounter (Signed)
No showed/cancelled new patient appointment

## 2015-08-17 ENCOUNTER — Ambulatory Visit (INDEPENDENT_AMBULATORY_CARE_PROVIDER_SITE_OTHER): Payer: BLUE CROSS/BLUE SHIELD | Admitting: Neurology

## 2015-08-17 ENCOUNTER — Encounter: Payer: Self-pay | Admitting: Neurology

## 2015-08-17 VITALS — BP 134/81 | HR 97 | Ht 66.0 in | Wt 239.0 lb

## 2015-08-17 DIAGNOSIS — H538 Other visual disturbances: Secondary | ICD-10-CM | POA: Diagnosis not present

## 2015-08-17 DIAGNOSIS — R0683 Snoring: Secondary | ICD-10-CM | POA: Insufficient documentation

## 2015-08-17 DIAGNOSIS — R51 Headache: Secondary | ICD-10-CM | POA: Diagnosis not present

## 2015-08-17 DIAGNOSIS — G4489 Other headache syndrome: Secondary | ICD-10-CM | POA: Diagnosis not present

## 2015-08-17 DIAGNOSIS — R519 Headache, unspecified: Secondary | ICD-10-CM

## 2015-08-17 MED ORDER — TOPIRAMATE 25 MG PO TABS: 25.0000 mg | ORAL_TABLET | Freq: Two times a day (BID) | ORAL | Status: DC

## 2015-08-17 NOTE — Progress Notes (Signed)
[facility]    Provider:  Dr Jaynee Eagles Referring Provider: Alycia Rossetti, MD Primary Care Physician:  Vic Blackbird, MD  CC:  headaches  HPI:  Angie Powell is a 49 y.o. female here as a referral from Dr. Buelah Manis for headaches. Started as a teenager. She first had headaches with aspertame in her teens. She has been Dxed with sinus headaches. She went to a doctor and was later diagnosed with migraines. She was told she has chronic sinusitis. This past May she started getting headaches every month, Dr, Constance Holster evaluated her sinuses as not being contributory and Dxed with migaines.She gets the headaches in the middle of the night . She snores. Not excessively tired during the day. She wakes up with headaches. Not always though, sometimes the headaches occur during the day but usually she wakes with them. Her head would feel congested in the morning. If she wakes up with headache, she moves around and it gets better. She has gained weight recently. Headaches are in the frontal area of the face, sometimes on the top, more pressure than throbbing, light doesn't bother her, doesn't have to go into a dark room, no phonophobia or usual nausea. She has had throbbing headaches with nausea but that is not the usual headache. She usually wakes up with headache, She has at least 8 headache days a month. Can be severe. She takes excedrin migraine or a pseudophed. No hearing changes, some vision changes. Worsening.   Reviewed notes, labs and imaging from outside physicians, which showed;  Ct head 2010 showed No acute intracranial abnormalities including mass lesion or mass effect, hydrocephalus, extra-axial fluid collection, midline shift, hemorrhage, or acute infarction, large ischemic events (personally reviewed images)  MRi of the brain:  Comparison: 05/13 2009 MRI. Multiple CT examinations going back as far as 2003.  Findings: The brain has a normal appearance on all pulse sequences  without evidence of atrophy, infarction, mass lesion, hemorrhage, hydrocephalus or extra-axial collection. Sinuses, middle ears and mastoids are clear. Nasopharyngeal tonsillar tissue appears the same. I think the clivus is normal. There is been no change in the appearance since 05/13. There is an anterior cleft of the clivus that I do not believe is pathologic.  IMPRESSION: No change in appearance since May 8. Normal appearing brain. I am not concerned about the appearance of the nasopharynx and clivus.   Review of Systems: Patient complains of symptoms per HPI as well as the following symptoms: lymph nodes, swelling in legs, joint pain, joint swelling, allergues, headaches. Pertinent negatives per HPI. All others negative.   Social History   Social History  . Marital Status: Married    Spouse Name: N/A  . Number of Children: 1  . Years of Education: 16   Occupational History  . Writer    Social History Main Topics  . Smoking status: Never Smoker   . Smokeless tobacco: Never Used  . Alcohol Use: 0.0 oz/week    0 Standard drinks or equivalent per week     Comment: Socially- 1-2 per year (08/17/15)  . Drug Use: No  . Sexual Activity: Yes   Other Topics Concern  . Not on file   Social History Narrative   Lives at home with daughter.   Caffeine use:  1 cup daily    Family History  Problem Relation Age of Onset  . Cancer Mother     parathyroid   . Diabetes Mother   . Hyperlipidemia Mother   .  Hypertension Mother   . Heart disease Maternal Grandmother   . Alcohol abuse Neg Hx   . Drug abuse Neg Hx   . Bipolar disorder Neg Hx   . Anxiety disorder Neg Hx   . Dementia Neg Hx   . Depression Neg Hx   . OCD Neg Hx   . Paranoid behavior Neg Hx   . Schizophrenia Neg Hx   . Seizures Neg Hx   . Sexual abuse Neg Hx   . Physical abuse Neg Hx   . Migraines Neg Hx     Past Medical History  Diagnosis Date  . Right foot pain   . GERD (gastroesophageal reflux  disease)   . Obesity   . Asthma   . Tibialis tendinitis   . Family history of diabetes mellitus   . Depression   . Arthritis   . Benign neoplasm of thyroid   . Heartburn     Past Surgical History  Procedure Laterality Date  . Appendectomy    . Tonsillectomy    . Right thyroidectomy, benign disease  06/2008  . Tubal ligation      Current Outpatient Prescriptions  Medication Sig Dispense Refill  . albuterol (PROVENTIL HFA) 108 (90 BASE) MCG/ACT inhaler Inhale 2 puffs into the lungs every 4 (four) hours as needed for wheezing. 1 Inhaler 3  . azelastine (ASTEPRO) 137 MCG/SPRAY nasal spray 2 sprays by Nasal route 2 (two) times daily. Use in each nostril as directed     . b complex vitamins tablet Take 1 tablet by mouth daily.    . beclomethasone (QVAR) 40 MCG/ACT inhaler Inhale 2 puffs into the lungs 2 (two) times daily.    Marland Kitchen loratadine-pseudoephedrine (CLARITIN-D 24-HOUR) 10-240 MG per 24 hr tablet Take 1 tablet by mouth daily. Does not take 24hr. Just daily po    . montelukast (SINGULAIR) 10 MG tablet Take 10 mg by mouth at bedtime.      . Omega-3 Fatty Acids (FISH OIL) 1000 MG CAPS Take by mouth daily.    Marland Kitchen omeprazole-sodium bicarbonate (ZEGERID) 40-1100 MG per capsule Take 1 capsule by mouth daily before breakfast.      . triamcinolone (NASACORT) 55 MCG/ACT AERO nasal inhaler Place 2 sprays into the nose daily as needed.    . vitamin C (ASCORBIC ACID) 500 MG tablet Take 500 mg by mouth daily.    . Vitamin D, Ergocalciferol, (DRISDOL) 50000 UNITS CAPS capsule Take 1 capsule (50,000 Units total) by mouth every 7 (seven) days. 12 capsule 0  . vitamin E 200 UNIT capsule Take 200 Units by mouth daily.    Marland Kitchen topiramate (TOPAMAX) 25 MG tablet Take 1 tablet (25 mg total) by mouth 2 (two) times daily. 60 tablet 6   No current facility-administered medications for this visit.    Allergies as of 08/17/2015  . (No Known Allergies)    Vitals: BP 134/81 mmHg  Pulse 97  Ht 5\' 6"  (1.676 m)   Wt 239 lb (108.41 kg)  BMI 38.59 kg/m2 Last Weight:  Wt Readings from Last 1 Encounters:  08/17/15 239 lb (108.41 kg)   Last Height:   Ht Readings from Last 1 Encounters:  08/17/15 5\' 6"  (1.676 m)   Physical exam: Exam: Gen: NAD, conversant, well nourised, obese, well groomed                     CV: RRR, no MRG. No Carotid Bruits. No peripheral edema, warm, nontender Eyes: Conjunctivae clear without exudates  or hemorrhage  Neuro: Detailed Neurologic Exam  Speech:    Speech is normal; fluent and spontaneous with normal comprehension.  Cognition:    The patient is oriented to person, place, and time;     recent and remote memory intact;     language fluent;     normal attention, concentration,     fund of knowledge Cranial Nerves:    The pupils are equal, round, and reactive to light. The fundi are normal and spontaneous venous pulsations are present. Visual fields are full to finger confrontation. Extraocular movements are intact. Trigeminal sensation is intact and the muscles of mastication are normal. The face is symmetric. The palate elevates in the midline. Hearing intact. Voice is normal. Shoulder shrug is normal. The tongue has normal motion without fasciculations.   Coordination:    Normal finger to nose and heel to shin. Normal rapid alternating movements.   Gait:    Heel-toe and tandem gait are normal.   Motor Observation:    No asymmetry, no atrophy, and no involuntary movements noted. Tone:    Normal muscle tone.    Posture:    Posture is normal. normal erect    Strength:    Strength is V/V in the upper and lower limbs.      Sensation: intact to LT     Reflex Exam:  DTR's:    Deep tendon reflexes in the upper and lower extremities are normal bilaterally.   Toes:    The toes are downgoing bilaterally.   Clonus:    Clonus is absent.       Assessment/Plan:  49 year old with worsening headaches, has been diagnosed with migraines in the past but  recent onset headaches are different, more pressure without migrainous symptoms. Neuro exam nml. She wakes up with headaches, question OSA or hypoventilation obesity syndrome and will refer for sleep evaluation. Will also repeat MRi of the brain. Will start topamax. CMP today.   Sarina Ill, MD  [facility] [address] [place], [place] [address]  Phone [phone] Fax [phone]

## 2015-08-17 NOTE — Patient Instructions (Signed)
Remember to drink plenty of fluid, eat healthy meals and do not skip any meals. Try to eat protein with a every meal and eat a healthy snack such as fruit or nuts in between meals. Try to keep a regular sleep-wake schedule and try to exercise daily, particularly in the form of walking, 20-30 minutes a day, if you can.   As far as your medications are concerned, I would like to suggest:  Topamax 25mg  twice daily Sleep consult  As far as diagnostic testing: MRi brain, labs  I would like to see you back in 3 months, sooner if we need to. Please call us with any interim questions, concerns, problems, updates or refill requests.   Our phone number is [phone]. We also have an after hours call service for urgent matters and there is a physician on-call for urgent questions. For any emergencies you know to call 911 or go to the nearest emergency room

## 2015-08-18 ENCOUNTER — Telehealth: Payer: Self-pay | Admitting: *Deleted

## 2015-08-18 LAB — COMPREHENSIVE METABOLIC PANEL
A/G RATIO: 1.5 (ref 1.1–2.5)
ALT: 18 IU/L (ref 0–32)
AST: 21 IU/L (ref 0–40)
Albumin: 4.1 g/dL (ref 3.5–5.5)
Alkaline Phosphatase: 79 IU/L (ref 39–117)
BUN/Creatinine Ratio: 13 (ref 9–23)
BUN: 11 mg/dL (ref 6–24)
Bilirubin Total: 0.3 mg/dL (ref 0.0–1.2)
CO2: 26 mmol/L (ref 18–29)
Calcium: 9.4 mg/dL (ref 8.7–10.2)
Chloride: 102 mmol/L (ref 97–108)
Creatinine, Ser: 0.88 mg/dL (ref 0.57–1.00)
GFR calc Af Amer: 88 mL/min/{1.73_m2} (ref >59)
GFR, EST NON AFRICAN AMERICAN: 76 mL/min/{1.73_m2} (ref >59)
Globulin, Total: 2.8 g/dL (ref 1.5–4.5)
Glucose: 112 mg/dL — ABNORMAL HIGH (ref 65–99)
Potassium: 4 mmol/L (ref 3.5–5.2)
Sodium: 143 mmol/L (ref 134–144)
Total Protein: 6.9 g/dL (ref 6.0–8.5)

## 2015-08-18 NOTE — Telephone Encounter (Signed)
-----   Message from Melvenia Beam, MD sent at 08/18/2015  9:51 AM EDT ----- Let patient know labs normal thanks

## 2015-08-18 NOTE — Telephone Encounter (Signed)
Called and spoke to pt about normal lab work. She verbalized understanding.

## 2015-09-11 ENCOUNTER — Encounter: Payer: Self-pay | Admitting: Neurology

## 2015-09-11 ENCOUNTER — Ambulatory Visit (INDEPENDENT_AMBULATORY_CARE_PROVIDER_SITE_OTHER): Payer: BLUE CROSS/BLUE SHIELD | Admitting: Neurology

## 2015-09-11 VITALS — BP 106/68 | HR 78 | Resp 16 | Ht 66.0 in | Wt 238.0 lb

## 2015-09-11 DIAGNOSIS — R51 Headache: Secondary | ICD-10-CM | POA: Diagnosis not present

## 2015-09-11 DIAGNOSIS — R351 Nocturia: Secondary | ICD-10-CM

## 2015-09-11 DIAGNOSIS — G479 Sleep disorder, unspecified: Secondary | ICD-10-CM | POA: Diagnosis not present

## 2015-09-11 DIAGNOSIS — G2581 Restless legs syndrome: Secondary | ICD-10-CM

## 2015-09-11 DIAGNOSIS — E669 Obesity, unspecified: Secondary | ICD-10-CM

## 2015-09-11 DIAGNOSIS — M7989 Other specified soft tissue disorders: Secondary | ICD-10-CM

## 2015-09-11 DIAGNOSIS — R519 Headache, unspecified: Secondary | ICD-10-CM

## 2015-09-11 NOTE — Progress Notes (Signed)
Subjective:    Patient ID: Angie Powell is a 49 y.o. female.  HPI     Star Age, MD, PhD [facility] [address], [address].O. [place], [place] [address]  Dear [name],   I saw your patient, Angie Powell, upon your kind request in my clinic today for initial consultation of her sleep disorder, in particular, concern for underlying obstructive sleep apnea. The patient is unaccompanied today. As you know, [name] is a 49 year old right-handed woman with an underlying medical history of reflux disease, asthma, depression, arthritis, recurrent headaches and obesity, who reports morning headaches and non-restorative sleep. She has an ESS of 3/24 today, and her fatigue score is 31 out of 63. I reviewed your office note from 08/17/2015. She started topamax and c/o loss of appetite, she takes it infrequently, maybe every other day, and reports improvement of her headaches since she started topiramate.  She reports nocturia usually once per night. She has occasional RLS symptoms but is not sure if she kicks her legs in her sleep. 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 She drinks caffeine in the form of coffee, 1 cup per day. She goes to bed around 11 PM and rise time 7 AM and she has a FHx of OSA in her father, who has a CPAP machine. She has swelling of her legs chronically and has seen a vascular and pain specialist. She is supposed to make a follow-up appointment.  she does not drink alcohol or smoke cigarettes.   Her Past Medical History Is Significant For: Past Medical History  Diagnosis Date  . Right foot pain   . GERD (gastroesophageal reflux disease)   . Obesity   . Asthma   . Tibialis tendinitis   . Family history of diabetes mellitus   . Depression   . Arthritis   . Benign neoplasm of thyroid   . Heartburn     Her Past Surgical History Is Significant For: Past Surgical History  Procedure Laterality Date  . Appendectomy    .  Tonsillectomy    . Right thyroidectomy, benign disease  06/2008  . Tubal ligation      Her Family History Is Significant For: Family History  Problem Relation Age of Onset  . Cancer Mother     parathyroid   . Diabetes Mother   . Hyperlipidemia Mother   . Hypertension Mother   . Heart disease Maternal Grandmother   . Alcohol abuse Neg Hx   . Drug abuse Neg Hx   . Bipolar disorder Neg Hx   . Anxiety disorder Neg Hx   . Dementia Neg Hx   . Depression Neg Hx   . OCD Neg Hx   . Paranoid behavior Neg Hx   . Schizophrenia Neg Hx   . Seizures Neg Hx   . Sexual abuse Neg Hx   . Physical abuse Neg Hx   . Migraines Neg Hx     Her Social History Is Significant For: Social History   Social History  . Marital Status: Married    Spouse Name: N/A  . Number of Children: 1  . Years of Education: 16   Occupational History  . Writer    Social History Main Topics  . Smoking status: Never Smoker   . Smokeless tobacco: Never Used  . Alcohol Use: 0.0 oz/week    0 Standard drinks or equivalent per week     Comment: Socially- 1-2 per year (  08/17/15)  . Drug Use: No  . Sexual Activity: Yes   Other Topics Concern  . None   Social History Narrative   Lives at home with daughter.   Caffeine use:  1 cup daily    Her Allergies Are:  No Known Allergies:   Her Current Medications Are:  Outpatient Encounter Prescriptions as of 09/11/2015  Medication Sig  . albuterol (PROVENTIL HFA) 108 (90 BASE) MCG/ACT inhaler Inhale 2 puffs into the lungs every 4 (four) hours as needed for wheezing.  Marland Kitchen azelastine (ASTEPRO) 137 MCG/SPRAY nasal spray 2 sprays by Nasal route 2 (two) times daily. Use in each nostril as directed   . b complex vitamins tablet Take 1 tablet by mouth daily.  . beclomethasone (QVAR) 40 MCG/ACT inhaler Inhale 2 puffs into the lungs 2 (two) times daily.  . montelukast (SINGULAIR) 10 MG tablet Take 10 mg by mouth at bedtime.    . Omega-3 Fatty Acids (FISH OIL) 1000 MG  CAPS Take by mouth daily.  Marland Kitchen omeprazole-sodium bicarbonate (ZEGERID) 40-1100 MG per capsule Take 1 capsule by mouth daily before breakfast.    . topiramate (TOPAMAX) 25 MG tablet Take 1 tablet (25 mg total) by mouth 2 (two) times daily.  Marland Kitchen triamcinolone (NASACORT) 55 MCG/ACT AERO nasal inhaler Place 2 sprays into the nose daily as needed.  . vitamin C (ASCORBIC ACID) 500 MG tablet Take 500 mg by mouth daily.  . Vitamin D, Ergocalciferol, (DRISDOL) 50000 UNITS CAPS capsule Take 1 capsule (50,000 Units total) by mouth every 7 (seven) days.  . vitamin E 200 UNIT capsule Take 200 Units by mouth daily.  . [DISCONTINUED] loratadine-pseudoephedrine (CLARITIN-D 24-HOUR) 10-240 MG per 24 hr tablet Take 1 tablet by mouth daily. Does not take 24hr. Just daily po   No facility-administered encounter medications on file as of 09/11/2015.  :  Review of Systems:  Out of a complete 14 point review of systems, all are reviewed and negative with the exception of these symptoms as listed below:   Review of Systems  Cardiovascular: Positive for leg swelling.  Allergic/Immunologic: Positive for environmental allergies.  Neurological: Positive for headaches.       No trouble falling or staying asleep, wakes up in the morning feeling tired, morning headaches, denies taking naps.    Epworth Sleepiness Scale 0= would never doze 1= slight chance of dozing 2= moderate chance of dozing 3= high chance of dozing  Sitting and reading:1 Watching TV:0 Sitting inactive in a public place (ex. Theater or meeting):0 As a passenger in a car for an hour without a break:0 Lying down to rest in the afternoon:2 Sitting and talking to someone:0 Sitting quietly after lunch (no alcohol):0 In a car, while stopped in traffic:0 Total:3 Objective:  Neurologic Exam  Physical Exam Physical Examination:   Filed Vitals:   09/11/15 1444  BP: 106/68  Pulse: 78  Resp: 16   General Examination: The patient is a very pleasant  49 y.o. female in no acute distress. She appears well-developed and well-nourished and well groomed.   HEENT: Normocephalic, atraumatic, pupils are equal, round and reactive to light and accommodation. Funduscopic exam is normal with sharp disc margins noted. Extraocular tracking is good without limitation to gaze excursion or nystagmus noted. Normal smooth pursuit is noted. Hearing is grossly intact. Tympanic membranes are clear bilaterally. Face is symmetric with normal facial animation and normal facial sensation. Speech is clear with no dysarthria noted. There is no hypophonia. There is no lip, neck/head, jaw  or voice tremor. Neck is supple with full range of passive and active motion. There are no carotid bruits on auscultation. Oropharynx exam reveals: mild mouth dryness, good dental hygiene and moderate airway crowding, due to wider uvula, smaller airway entry and large tongue. Tonsils are absent. Mallampati is class II. Tongue protrudes centrally and palate elevates symmetrically. Neck circumference is 15-1/2 inches.   Chest: Clear to auscultation without wheezing, rhonchi or crackles noted.  Heart: S1+S2+0, regular and normal without murmurs, rubs or gallops noted.   Abdomen: Soft, non-tender and non-distended with normal bowel sounds appreciated on auscultation.  Extremities: There is 1+ non-pitting edema in the distal lower extremities bilaterally. Pedal pulses are intact.  Skin: Warm and dry without trophic changes noted. There are no varicose veins.  Musculoskeletal: exam reveals no obvious joint deformities, tenderness or joint swelling or erythema.   Neurologically:  Mental status: The patient is awake, alert and oriented in all 4 spheres. Her immediate and remote memory, attention, language skills and fund of knowledge are appropriate. There is no evidence of aphasia, agnosia, apraxia or anomia. Speech is clear with normal prosody and enunciation. Thought process is linear. Mood is  normal and affect is normal.  Cranial nerves II - XII are as described above under HEENT exam. In addition: shoulder shrug is normal with equal shoulder height noted. Motor exam: Normal bulk, strength and tone is noted. There is no drift, tremor or rebound. Romberg is negative. Reflexes are 1+ throughout. Fine motor skills and coordination: intact with normal finger taps, normal hand movements, normal rapid alternating patting, normal foot taps and normal foot agility.  Cerebellar testing: No dysmetria or intention tremor on finger to nose testing. Heel to shin is unremarkable bilaterally. There is no truncal or gait ataxia.  Sensory exam: intact to light touch in the upper and lower extremities.  Gait, station and balance: She stands easily. No veering to one side is noted. No leaning to one side is noted. Posture is age-appropriate and stance is narrow based. Gait shows normal stride length and normal pace. No problems turning are noted. She turns en bloc. Tandem walk is unremarkable.  Assessment and Plan:  In summary, SHACONDA HAJDUK is a very pleasant 49 y.o.-year old female with an underlying medical history of reflux disease, asthma, depression, arthritis, and lower extremity swelling, recurrent headaches and obesity, whose history and physical exam are indeed concerning for obstructive sleep apnea (OSA). While she does not have a telltale history of sleep apnea, she has a family history of sleep apnea, a crowded looking airway, obesity, morning headaches, nocturia, lower extremity swelling and morning headaches. In addition, she reports restless leg symptoms.  I had a long chat with the patient about my findings and the diagnosis of OSA, its prognosis and treatment options. We talked about medical treatments, surgical interventions and non-pharmacological approaches. I explained in particular the risks and ramifications of untreated moderate to severe OSA, especially with respect to developing  cardiovascular disease down the Road, including congestive heart failure, difficult to treat hypertension, cardiac arrhythmias, or stroke. Even type 2 diabetes has, in part, been linked to untreated OSA. Symptoms of untreated OSA include daytime sleepiness, memory problems, mood irritability and mood disorder such as depression and anxiety, lack of energy, as well as recurrent headaches, especially morning headaches. We talked about smoking cessation and trying to maintain a healthy lifestyle in general, as well as the importance of weight control. I encouraged the patient to eat healthy, exercise daily  and keep well hydrated, to keep a scheduled bedtime and wake time routine, to not skip any meals and eat healthy snacks in between meals. I advised the patient not to drive when feeling sleepy. I recommended the following at this time: sleep study with potential positive airway pressure titration. (We will score hypopneas at 3% and split the sleep study into diagnostic and treatment portion, if the estimated. 2 hour AHI is >15/h).   I explained the sleep test procedure to the patient and also outlined possible surgical and non-surgical treatment options of OSA, including the use of a custom-made dental device (which would require a referral to a specialist dentist or oral surgeon), upper airway surgical options, such as pillar implants, radiofrequency surgery, tongue base surgery, and UPPP (which would involve a referral to an ENT surgeon). Rarely, jaw surgery such as mandibular advancement may be considered.  I also explained the CPAP treatment option to the patient, who indicated that she would be willing to try CPAP if the need arises. I explained the importance of being compliant with PAP treatment, not only for insurance purposes but primarily to improve Her symptoms, and for the patient's long term health benefit, including to reduce Her cardiovascular risks. I answered all her questions today and the  patient was in agreement. I would like to see her back after the sleep study is completed and encouraged her to call with any interim questions, concerns, problems or updates.   Thank you very much for allowing me to participate in the care of this nice patient. If I can be of any further assistance to you please do not hesitate to talk to me.  Sincerely,   Star Age, MD, PhD

## 2015-09-11 NOTE — Patient Instructions (Signed)

## 2015-10-18 ENCOUNTER — Ambulatory Visit (INDEPENDENT_AMBULATORY_CARE_PROVIDER_SITE_OTHER): Payer: BLUE CROSS/BLUE SHIELD | Admitting: Neurology

## 2015-10-18 DIAGNOSIS — G4733 Obstructive sleep apnea (adult) (pediatric): Secondary | ICD-10-CM

## 2015-10-18 DIAGNOSIS — G4761 Periodic limb movement disorder: Secondary | ICD-10-CM

## 2015-10-18 DIAGNOSIS — G4720 Circadian rhythm sleep disorder, unspecified type: Secondary | ICD-10-CM

## 2015-10-19 ENCOUNTER — Telehealth: Payer: Self-pay | Admitting: Neurology

## 2015-10-19 DIAGNOSIS — G4761 Periodic limb movement disorder: Secondary | ICD-10-CM

## 2015-10-19 DIAGNOSIS — G4733 Obstructive sleep apnea (adult) (pediatric): Secondary | ICD-10-CM

## 2015-10-19 NOTE — Sleep Study (Signed)
Please see the scanned sleep study interpretation located in the procedure tab within the chart review section.   

## 2015-10-19 NOTE — Telephone Encounter (Signed)
I spoke to patient and she is aware of results and recommendations. She would like to proceed with titration study.  

## 2015-10-19 NOTE — Telephone Encounter (Signed)
Patient referred by Dr. Jaynee Eagles, seen by me on 09/11/15, diagnostic PSG on 10/18/15, ins: [employer].   Please call and notify the patient that the recent sleep study did confirm the diagnosis of moderate obstructive sleep apnea and that I recommend treatment for this in the form of CPAP. This will require a repeat sleep study for proper titration and mask fitting. Please explain to patient and arrange for a CPAP titration study. I have placed an order in the chart. Thanks, and please route to [facility] for scheduling next sleep study.  Star Age, MD, PhD [facility])

## 2015-11-23 ENCOUNTER — Ambulatory Visit: Payer: BLUE CROSS/BLUE SHIELD | Admitting: Neurology

## 2015-11-23 ENCOUNTER — Telehealth: Payer: Self-pay | Admitting: *Deleted

## 2015-11-23 NOTE — Telephone Encounter (Signed)
no showed f/u appt 

## 2015-11-24 ENCOUNTER — Encounter: Payer: Self-pay | Admitting: Neurology

## 2016-03-21 DIAGNOSIS — J301 Allergic rhinitis due to pollen: Secondary | ICD-10-CM | POA: Diagnosis not present

## 2016-04-18 DIAGNOSIS — J301 Allergic rhinitis due to pollen: Secondary | ICD-10-CM | POA: Diagnosis not present

## 2016-05-09 DIAGNOSIS — J301 Allergic rhinitis due to pollen: Secondary | ICD-10-CM | POA: Diagnosis not present

## 2016-05-16 DIAGNOSIS — J301 Allergic rhinitis due to pollen: Secondary | ICD-10-CM | POA: Diagnosis not present

## 2016-05-30 DIAGNOSIS — Z6837 Body mass index (BMI) 37.0-37.9, adult: Secondary | ICD-10-CM | POA: Diagnosis not present

## 2016-05-30 DIAGNOSIS — Z1231 Encounter for screening mammogram for malignant neoplasm of breast: Secondary | ICD-10-CM | POA: Diagnosis not present

## 2016-05-30 DIAGNOSIS — Z01419 Encounter for gynecological examination (general) (routine) without abnormal findings: Secondary | ICD-10-CM | POA: Diagnosis not present

## 2016-06-06 DIAGNOSIS — J301 Allergic rhinitis due to pollen: Secondary | ICD-10-CM | POA: Diagnosis not present

## 2016-06-13 DIAGNOSIS — J0100 Acute maxillary sinusitis, unspecified: Secondary | ICD-10-CM | POA: Diagnosis not present

## 2016-06-13 DIAGNOSIS — J4540 Moderate persistent asthma, uncomplicated: Secondary | ICD-10-CM | POA: Diagnosis not present

## 2016-06-13 DIAGNOSIS — J301 Allergic rhinitis due to pollen: Secondary | ICD-10-CM | POA: Diagnosis not present

## 2016-08-08 DIAGNOSIS — J3089 Other allergic rhinitis: Secondary | ICD-10-CM | POA: Diagnosis not present

## 2016-08-08 DIAGNOSIS — J301 Allergic rhinitis due to pollen: Secondary | ICD-10-CM | POA: Diagnosis not present

## 2016-08-15 ENCOUNTER — Ambulatory Visit (INDEPENDENT_AMBULATORY_CARE_PROVIDER_SITE_OTHER): Payer: BLUE CROSS/BLUE SHIELD | Admitting: Physician Assistant

## 2016-08-15 ENCOUNTER — Encounter: Payer: Self-pay | Admitting: Physician Assistant

## 2016-08-15 VITALS — BP 124/70 | HR 99 | Temp 98.3°F | Resp 16 | Wt 233.0 lb

## 2016-08-15 DIAGNOSIS — J301 Allergic rhinitis due to pollen: Secondary | ICD-10-CM | POA: Diagnosis not present

## 2016-08-15 DIAGNOSIS — R5383 Other fatigue: Secondary | ICD-10-CM | POA: Diagnosis not present

## 2016-08-15 LAB — CBC WITH DIFFERENTIAL/PLATELET
BASOS ABS: 78 {cells}/uL (ref 0–200)
Basophils Relative: 1 %
EOS ABS: 156 {cells}/uL (ref 15–500)
Eosinophils Relative: 2 %
HEMATOCRIT: 41.1 % (ref 35.0–45.0)
HEMOGLOBIN: 13.4 g/dL (ref 12.0–15.0)
LYMPHS PCT: 38 %
Lymphs Abs: 2964 cells/uL (ref 850–3900)
MCH: 28.1 pg (ref 27.0–33.0)
MCHC: 32.6 g/dL (ref 32.0–36.0)
MCV: 86.2 fL (ref 80.0–100.0)
MONO ABS: 546 {cells}/uL (ref 200–950)
MPV: 11.7 fL (ref 7.5–12.5)
Monocytes Relative: 7 %
NEUTROS PCT: 52 %
Neutro Abs: 4056 cells/uL (ref 1500–7800)
Platelets: 331 10*3/uL (ref 140–400)
RBC: 4.77 MIL/uL (ref 3.80–5.10)
RDW: 13.6 % (ref 11.0–15.0)
WBC: 7.8 10*3/uL (ref 3.8–10.8)

## 2016-08-15 NOTE — Progress Notes (Signed)
Patient ID: Angie Powell MRN: [id number], DOB: 19-May-1966, 50 y.o. Date of Encounter: 08/15/2016, 3:15 PM    Chief Complaint:  Chief Complaint  Patient presents with  . office visit    not feeling well      HPI: 50 y.o. year old AA female says that for a while now, recently, she "just hasn't felt good."  Says that she just feels tired. Says that even after she feels like she slept good, she'll wake up and just felt tired. She mentions several things that she was worried that it could be from.  She was afraid that it "could be because her sugar is low " also says that she has been told that she had sleep apnea.  Also says that sometimes she has "sinuses " Later in the conversation I asked if she was using a CPAP. She says "they said for me to do another sleep study--- I just haven't done it "I encouraged her to follow-up with this.  She has no other complaints or concerns.     Home Meds:   Outpatient Medications Prior to Visit  Medication Sig Dispense Refill  . albuterol (PROVENTIL HFA) 108 (90 BASE) MCG/ACT inhaler Inhale 2 puffs into the lungs every 4 (four) hours as needed for wheezing. 1 Inhaler 3  . azelastine (ASTEPRO) 137 MCG/SPRAY nasal spray 2 sprays by Nasal route 2 (two) times daily. Use in each nostril as directed     . beclomethasone (QVAR) 40 MCG/ACT inhaler Inhale 2 puffs into the lungs 2 (two) times daily.    . montelukast (SINGULAIR) 10 MG tablet Take 10 mg by mouth at bedtime.      . Omega-3 Fatty Acids (FISH OIL) 1000 MG CAPS Take by mouth daily.    Marland Kitchen omeprazole-sodium bicarbonate (ZEGERID) 40-1100 MG per capsule Take 1 capsule by mouth daily before breakfast.      . triamcinolone (NASACORT) 55 MCG/ACT AERO nasal inhaler Place 2 sprays into the nose daily as needed.    . Vitamin D, Ergocalciferol, (DRISDOL) 50000 UNITS CAPS capsule Take 1 capsule (50,000 Units total) by mouth every 7 (seven) days. 12 capsule 0  . b complex vitamins tablet Take 1 tablet by  mouth daily.    Marland Kitchen topiramate (TOPAMAX) 25 MG tablet Take 1 tablet (25 mg total) by mouth 2 (two) times daily. (Patient not taking: Reported on 08/15/2016) 60 tablet 6  . vitamin C (ASCORBIC ACID) 500 MG tablet Take 500 mg by mouth daily.    . vitamin E 200 UNIT capsule Take 200 Units by mouth daily.     No facility-administered medications prior to visit.     Allergies: No Known Allergies    Review of Systems: See HPI for pertinent ROS. All other ROS negative.    Physical Exam: Blood pressure 124/70, pulse 99, temperature 98.3 F (36.8 C), temperature source Oral, resp. rate 16, weight 233 lb (105.7 kg), last menstrual period 08/01/2016, SpO2 98 %., Body mass index is 37.61 kg/m. General: Obese AAF.  Appears in no acute distress. Neck: Supple. No thyromegaly. No lymphadenopathy. Lungs: Clear bilaterally to auscultation without wheezes, rales, or rhonchi. Breathing is unlabored. Heart: Regular rhythm. No murmurs, rubs, or gallops. Msk:  Strength and tone normal for age. Extremities/Skin: Warm and dry. Neuro: Alert and oriented X 3. Moves all extremities spontaneously. Gait is normal. CNII-XII grossly in tact. Psych:  Responds to questions appropriately with a normal affect.     ASSESSMENT AND PLAN:  50 y.o.  year old female with  1. Fatigue, unspecified type Will check labs to evaluate for anemia, electrolyte imbalances, glucose abnormality, thyroid abnormality as cause to her fatigue. If these labs are normal then recommend she follow up for sleep study. Also the symptoms could be within normal given her age and hormone shifts etc. - CBC with Differential/Platelet - COMPLETE METABOLIC PANEL WITH GFR - TSH   Signed, [address] [place], [place], [facility] 08/15/2016 3:15 PM

## 2016-08-16 ENCOUNTER — Telehealth: Payer: Self-pay

## 2016-08-16 DIAGNOSIS — R5383 Other fatigue: Secondary | ICD-10-CM

## 2016-08-16 LAB — COMPLETE METABOLIC PANEL WITH GFR
ALBUMIN: 4.3 g/dL (ref 3.6–5.1)
ALK PHOS: 71 U/L (ref 33–130)
ALT: 18 U/L (ref 6–29)
AST: 19 U/L (ref 10–35)
BUN: 14 mg/dL (ref 7–25)
CALCIUM: 9.5 mg/dL (ref 8.6–10.4)
CHLORIDE: 104 mmol/L (ref 98–110)
CO2: 27 mmol/L (ref 20–31)
Creat: 0.95 mg/dL (ref 0.50–1.05)
GFR, EST AFRICAN AMERICAN: 80 mL/min (ref >=60)
GFR, Est Non African American: 69 mL/min (ref >=60)
Glucose, Bld: 78 mg/dL (ref 70–99)
POTASSIUM: 4.6 mmol/L (ref 3.5–5.3)
Sodium: 140 mmol/L (ref 135–146)
Total Bilirubin: 0.3 mg/dL (ref 0.2–1.2)
Total Protein: 7.1 g/dL (ref 6.1–8.1)

## 2016-08-16 LAB — TSH: TSH: 0.85 m[IU]/L

## 2016-08-16 MED ORDER — MOMETASONE FUROATE 0.1 % EX CREA
1.0000 | TOPICAL_CREAM | Freq: Two times a day (BID) | CUTANEOUS | 3 refills | Status: DC
Start: 2016-08-16 — End: 2017-07-11

## 2016-08-16 NOTE — Telephone Encounter (Signed)
Pt states she need an RX for mometasone cream for her eczema.

## 2016-08-16 NOTE — Telephone Encounter (Signed)
RX sent in

## 2016-08-16 NOTE — Telephone Encounter (Signed)
Okay to send Mometasone 0.1% apply to affected area BID Give largest tube Refill 3

## 2016-09-04 ENCOUNTER — Other Ambulatory Visit: Payer: Self-pay | Admitting: Family Medicine

## 2016-09-04 DIAGNOSIS — G4733 Obstructive sleep apnea (adult) (pediatric): Secondary | ICD-10-CM

## 2016-10-01 DIAGNOSIS — J301 Allergic rhinitis due to pollen: Secondary | ICD-10-CM | POA: Diagnosis not present

## 2016-10-10 DIAGNOSIS — J301 Allergic rhinitis due to pollen: Secondary | ICD-10-CM | POA: Diagnosis not present

## 2016-12-19 DIAGNOSIS — J301 Allergic rhinitis due to pollen: Secondary | ICD-10-CM | POA: Diagnosis not present

## 2016-12-25 DIAGNOSIS — J301 Allergic rhinitis due to pollen: Secondary | ICD-10-CM | POA: Diagnosis not present

## 2016-12-26 DIAGNOSIS — J301 Allergic rhinitis due to pollen: Secondary | ICD-10-CM | POA: Diagnosis not present

## 2016-12-28 DIAGNOSIS — R6889 Other general symptoms and signs: Secondary | ICD-10-CM | POA: Diagnosis not present

## 2016-12-28 DIAGNOSIS — R05 Cough: Secondary | ICD-10-CM | POA: Diagnosis not present

## 2016-12-31 DIAGNOSIS — J4540 Moderate persistent asthma, uncomplicated: Secondary | ICD-10-CM | POA: Diagnosis not present

## 2016-12-31 DIAGNOSIS — J4541 Moderate persistent asthma with (acute) exacerbation: Secondary | ICD-10-CM | POA: Diagnosis not present

## 2016-12-31 DIAGNOSIS — J301 Allergic rhinitis due to pollen: Secondary | ICD-10-CM | POA: Diagnosis not present

## 2017-02-06 DIAGNOSIS — J301 Allergic rhinitis due to pollen: Secondary | ICD-10-CM | POA: Diagnosis not present

## 2017-02-20 DIAGNOSIS — J301 Allergic rhinitis due to pollen: Secondary | ICD-10-CM | POA: Diagnosis not present

## 2017-02-25 DIAGNOSIS — J0100 Acute maxillary sinusitis, unspecified: Secondary | ICD-10-CM | POA: Diagnosis not present

## 2017-02-25 DIAGNOSIS — J4540 Moderate persistent asthma, uncomplicated: Secondary | ICD-10-CM | POA: Diagnosis not present

## 2017-02-25 DIAGNOSIS — J301 Allergic rhinitis due to pollen: Secondary | ICD-10-CM | POA: Diagnosis not present

## 2017-02-27 DIAGNOSIS — J301 Allergic rhinitis due to pollen: Secondary | ICD-10-CM | POA: Diagnosis not present

## 2017-03-06 DIAGNOSIS — J301 Allergic rhinitis due to pollen: Secondary | ICD-10-CM | POA: Diagnosis not present

## 2017-03-10 ENCOUNTER — Encounter: Payer: Self-pay | Admitting: Neurology

## 2017-03-10 ENCOUNTER — Encounter (INDEPENDENT_AMBULATORY_CARE_PROVIDER_SITE_OTHER): Payer: Self-pay

## 2017-03-10 ENCOUNTER — Ambulatory Visit (INDEPENDENT_AMBULATORY_CARE_PROVIDER_SITE_OTHER): Payer: BLUE CROSS/BLUE SHIELD | Admitting: Neurology

## 2017-03-10 VITALS — BP 128/86 | HR 82 | Resp 16 | Ht 66.0 in | Wt 234.0 lb

## 2017-03-10 DIAGNOSIS — G4761 Periodic limb movement disorder: Secondary | ICD-10-CM | POA: Diagnosis not present

## 2017-03-10 DIAGNOSIS — E669 Obesity, unspecified: Secondary | ICD-10-CM | POA: Diagnosis not present

## 2017-03-10 DIAGNOSIS — G2581 Restless legs syndrome: Secondary | ICD-10-CM

## 2017-03-10 DIAGNOSIS — R51 Headache: Secondary | ICD-10-CM

## 2017-03-10 DIAGNOSIS — R600 Localized edema: Secondary | ICD-10-CM

## 2017-03-10 DIAGNOSIS — G4733 Obstructive sleep apnea (adult) (pediatric): Secondary | ICD-10-CM

## 2017-03-10 DIAGNOSIS — R519 Headache, unspecified: Secondary | ICD-10-CM

## 2017-03-10 NOTE — Patient Instructions (Addendum)
I have entered an order for another sleep study to treat your moderate to severe obstructive sleep apnea (OSA) with CPAP. I will see you back after the study and hopefully prescribe a CPAP machine for home use through a DME (durable medical equipment) company of your choice.

## 2017-03-10 NOTE — Progress Notes (Signed)
Subjective:    Patient ID: Angie Powell is a 51 y.o. female.  HPI     Interim history:   Angie Powell is a 51 year old right-handed woman with an underlying medical history of reflux disease, asthma, depression, arthritis, recurrent headaches and obesity, who  Presents for follow-up consultation of her sleep disturbance, in particular her sleep apnea. The patient is unaccompanied today. I first met her on 09/11/2015 at the request of Dr. Jaynee Eagles, at which time she reported morning headaches, nonrestorative sleep and nocturia. She was invited for a sleep study. She had a baseline sleep study on 10/18/2015. She had a sleep efficiency of 90.8%, sleep latency was 17.5 minutes, REM latency was 64 minutes. She had an increased percentage of stage II sleep, decreased percentage of slow-wave sleep and REM sleep was 17.7%. She had mild to moderate snoring. She had a total AHI of 16.1 per hour, rising to 65.5 per hour during REM sleep and 23.4 per hour in the supine position. Average oxygen saturation was 95%, nadir was 75%, time below 88% saturation was 5 minutes and 56 seconds for the study. She was advised to proceed with a CPAP titration study. She did not have this done and did not return for follow-up.   Today, 03/10/2017 (all dictated new, as well as above notes, some dictation done in note pad or Word, outside of chart, may appear as copied):  She reports fairly unchanged sleep-related symptoms and recurrent headaches. She did not go back to see Dr. Jaynee Eagles. She did not continue with the Topamax she was prescribed by Dr. Jaynee Eagles and she felt it was not as helpful as taking Excedrin. She is advised that the Topamax was for headache prevention, not acute treatment. Her weight has remained the same, she does endorse morning headaches and also lower extremity swelling, for which she uses compression socks. The swelling was less when she was exercising on a regular basis. She does endorse some restless leg symptoms  and leg movements at night. The patient's allergies, current medications, family history, past medical history, past social history, past surgical history and problem list were reviewed and updated as appropriate.   Previously (copied from previous notes for reference):   09/11/2015: She reports morning headaches and non-restorative sleep. She has an ESS of 3/24 today, and her fatigue score is 31 out of 63. I reviewed your office note from 08/17/2015. She started topamax and c/o loss of appetite, she takes it infrequently, maybe every other day, and reports improvement of her headaches since she started topiramate.  She reports nocturia usually once per night. She has occasional RLS symptoms but is not sure if she kicks her legs in her sleep. 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 She drinks caffeine in the form of coffee, 1 cup per day. She goes to bed around 11 PM and rise time 7 AM and she has a FHx of OSA in her father, who has a CPAP machine. She has swelling of her legs chronically and has seen a vascular and pain specialist. She is supposed to make a follow-up appointment.  she does not drink alcohol or smoke cigarettes.   Her Past Medical History Is Significant For: Past Medical History:  Diagnosis Date  . Arthritis   . Asthma   . Benign neoplasm of thyroid   . Depression   . Family history of diabetes mellitus   . GERD (gastroesophageal reflux disease)   . Heartburn   . Obesity   .  Right foot pain   . Tibialis tendinitis     Her Past Surgical History Is Significant For: Past Surgical History:  Procedure Laterality Date  . APPENDECTOMY    . right thyroidectomy, benign disease  06/2008  . TONSILLECTOMY    . TUBAL LIGATION      Her Family History Is Significant For: Family History  Problem Relation Age of Onset  . Cancer Mother     parathyroid   . Diabetes Mother   . Hyperlipidemia Mother   . Hypertension Mother   . Heart disease Maternal Grandmother   .  Alcohol abuse Neg Hx   . Drug abuse Neg Hx   . Bipolar disorder Neg Hx   . Anxiety disorder Neg Hx   . Dementia Neg Hx   . Depression Neg Hx   . OCD Neg Hx   . Paranoid behavior Neg Hx   . Schizophrenia Neg Hx   . Seizures Neg Hx   . Sexual abuse Neg Hx   . Physical abuse Neg Hx   . Migraines Neg Hx     Her Social History Is Significant For: Social History   Social History  . Marital status: Single    Spouse name: N/A  . Number of children: 1  . Years of education: 40   Occupational History  . Writer    Social History Main Topics  . Smoking status: Never Smoker  . Smokeless tobacco: Never Used  . Alcohol use No  . Drug use: No  . Sexual activity: Yes   Other Topics Concern  . None   Social History Narrative   Lives at home with daughter.   Caffeine use:  4 cup daily    Her Allergies Are:  No Known Allergies:   Her Current Medications Are:  Outpatient Encounter Prescriptions as of 03/10/2017  Medication Sig  . albuterol (PROVENTIL HFA) 108 (90 BASE) MCG/ACT inhaler Inhale 2 puffs into the lungs every 4 (four) hours as needed for wheezing.  Marland Kitchen azelastine (ASTEPRO) 137 MCG/SPRAY nasal spray 2 sprays by Nasal route 2 (two) times daily. Use in each nostril as directed   . b complex vitamins tablet Take 1 tablet by mouth daily.  . beclomethasone (QVAR) 40 MCG/ACT inhaler Inhale 2 puffs into the lungs 2 (two) times daily.  . mometasone (ELOCON) 0.1 % cream Apply 1 application topically 2 (two) times daily.  . montelukast (SINGULAIR) 10 MG tablet Take 10 mg by mouth at bedtime.    Marland Kitchen omeprazole-sodium bicarbonate (ZEGERID) 40-1100 MG per capsule Take 1 capsule by mouth daily before breakfast.    . triamcinolone (NASACORT) 55 MCG/ACT AERO nasal inhaler Place 2 sprays into the nose daily as needed.  . vitamin C (ASCORBIC ACID) 500 MG tablet Take 500 mg by mouth daily.  . Vitamin D, Ergocalciferol, (DRISDOL) 50000 UNITS CAPS capsule Take 1 capsule (50,000 Units total)  by mouth every 7 (seven) days.  . vitamin E 200 UNIT capsule Take 200 Units by mouth daily.  . [DISCONTINUED] Omega-3 Fatty Acids (FISH OIL) 1000 MG CAPS Take by mouth daily.  . [DISCONTINUED] topiramate (TOPAMAX) 25 MG tablet Take 1 tablet (25 mg total) by mouth 2 (two) times daily. (Patient not taking: Reported on 08/15/2016)   No facility-administered encounter medications on file as of 03/10/2017.   :  Review of Systems:  Out of a complete 14 point review of systems, all are reviewed and negative with the exception of these symptoms as listed below:  Review  of Systems  Neurological:       Patient states that since last visit her headaches have increased in frequency. Usually occur in the morning. Currently takes Excedrin Migraine for her headaches, does not feel that Topamax helped her.   Snores, witnessed apnea.   \Epworth Sleepiness Scale 0= would never doze 1= slight chance of dozing 2= moderate chance of dozing 3= high chance of dozing  Sitting and reading:0 Watching TV:1 Sitting inactive in a public place (ex. Theater or meeting):0 As a passenger in a car for an hour without a break:0 Lying down to rest in the afternoon:0 Sitting and talking to someone:0 Sitting quietly after lunch (no alcohol):0 In a car, while stopped in traffic:0 Total:1  Objective:  Neurologic Exam  Physical Exam Physical Examination:   Vitals:   03/10/17 1125  BP: 128/86  Pulse: 82  Resp: 16   General Examination: The patient is a very pleasant 51 y.o. female in no acute distress. She appears well-developed and well-nourished and well groomed.   HEENT: Normocephalic, atraumatic, pupils are equal, round and reactive to light and accommodation. Extraocular tracking is good without limitation to gaze excursion or nystagmus noted. Normal smooth pursuit is noted. Hearing is grossly intact. Face is symmetric with normal facial animation and normal facial sensation. Speech is clear with no  dysarthria noted. There is no hypophonia. There is no lip, neck/head, jaw or voice tremor. Neck is supple with full range of passive and active motion. There are no carotid bruits on auscultation. Oropharynx exam reveals: mild mouth dryness, good dental hygiene and moderate airway crowding, due to wider uvula, smaller airway entry and larger tongue. Overall stable airway exam. Tonsils are absent, possible residual tonsillar tissue on the left.  Chest: Clear to auscultation without wheezing, rhonchi or crackles noted.  Heart: S1+S2+0, regular and normal without murmurs, rubs or gallops noted.   Abdomen: Soft, non-tender and non-distended with normal bowel sounds appreciated on auscultation.  Extremities: There is 1+ non-pitting edema in the distal lower extremities bilaterally, right side more than left.  Skin: Warm and dry without trophic changes noted.   Musculoskeletal: exam reveals no obvious joint deformities, tenderness or joint swelling or erythema.   Neurologically:  Mental status: The patient is awake, alert and oriented in all 4 spheres. Her immediate and remote memory, attention, language skills and fund of knowledge are appropriate. There is no evidence of aphasia, agnosia, apraxia or anomia. Speech is clear with normal prosody and enunciation. Thought process is linear. Mood is normal and affect is normal.  Cranial nerves II - XII are as described above under HEENT exam.  Motor exam: Normal bulk, strength and tone is noted. There is no drift, tremor or rebound. Romberg is negative. Reflexes are 1+ throughout. Fine motor skills and coordination: intact in the upper and lower extremities, cerebellar testing shows no dysmetria or intention tremor. She has no truncal or gait ataxia. Sensory exam: intact to light touch in the upper and lower extremities.  Gait, station and balance: She stands easily. No veering to one side is noted. No leaning to one side is noted. Posture is  age-appropriate and stance is narrow based. Gait shows normal stride length and normal pace. No problems turning are noted. Tandem walk is unremarkable.  Assessment and Plan:  In summary, Angie Powell is a very pleasant 51 year old female with an underlying medical history of reflux disease, asthma, depression, arthritis, and lower extremity swelling, recurrent headaches and obesity, who presents for  follow-up consultation of her moderate to severe obstructive sleep apnea and, after a long gap. We talked about her baseline sleep study results from 10/18/2015 in detail today. She is encouraged to proceed with CPAP therapy for which we will bring her back for a full night CPAP titration study. I again explained OSA medical treatments, surgical interventions and non-pharmacological approaches. I explained in particular the risks and ramifications of untreated moderate to severe OSA, especially with respect to developing cardiovascular disease down the Road, including congestive heart failure, difficult to treat hypertension, cardiac arrhythmias, or stroke. Even type 2 diabetes has, in part, been linked to untreated OSA. Symptoms of untreated OSA include daytime sleepiness, memory problems, mood irritability and mood disorder such as depression and anxiety, lack of energy, as well as recurrent headaches, especially morning headaches. We talked about smoking cessation and trying to maintain a healthy lifestyle in general, as well as the importance of weight control. I encouraged the patient to eat healthy, exercise daily and keep well hydrated, to keep a scheduled bedtime and wake time routine, to not skip any meals and eat healthy snacks in between meals. I advised the patient to consider making a FU with Dr. Jaynee Eagles, should she continue to have recurrent headaches despite sleep apnea treatment. At this time, we will bring her back for a full night CPAP titration study.  I explained the sleep test procedure to  the patient, who indicated that she would be willing to try CPAP if the need arises. I explained the importance of being compliant with PAP treatment, not only for insurance purposes but primarily to improve Her symptoms, and for the patient's long term health benefit, including to reduce Her cardiovascular risks. I answered all her questions today and the patient was in agreement. I spent 25 minutes in total face-to-face time with the patient, more than 50% of which was spent in counseling and coordination of care, reviewing test results, reviewing medication and discussing or reviewing the diagnosis of OSA, its prognosis and treatment options. Pertinent laboratory and imaging test results that were available during this visit with the patient were reviewed by me and considered in my medical decision making (see chart for details).

## 2017-03-13 DIAGNOSIS — J301 Allergic rhinitis due to pollen: Secondary | ICD-10-CM | POA: Diagnosis not present

## 2017-03-23 ENCOUNTER — Ambulatory Visit (INDEPENDENT_AMBULATORY_CARE_PROVIDER_SITE_OTHER): Payer: BLUE CROSS/BLUE SHIELD | Admitting: Neurology

## 2017-03-23 DIAGNOSIS — G4733 Obstructive sleep apnea (adult) (pediatric): Secondary | ICD-10-CM

## 2017-03-27 ENCOUNTER — Telehealth: Payer: Self-pay

## 2017-03-27 DIAGNOSIS — J301 Allergic rhinitis due to pollen: Secondary | ICD-10-CM | POA: Diagnosis not present

## 2017-03-27 NOTE — Progress Notes (Signed)
Patient referred by Dr. Jaynee Eagles, seen by me on 09/11/15, then on 03/10/17, diagnostic PSG on 10/18/15, CPAP study on 03/23/17.   Please call and inform patient that I have entered an order for treatment with positive airway pressure (PAP) treatment of obstructive sleep apnea (OSA). She did well during the latest sleep study with CPAP. We will, therefore, arrange for a machine for home use through a DME (durable medical equipment) company of Her choice; and I will see the patient back in follow-up in about 10 weeks. Please also explain to the patient that I will be looking out for compliance data, which can be downloaded from the machine (stored on an SD card, that is inserted in the machine) or via remote access through a modem, that is built into the machine. At the time of the followup appointment we will discuss sleep study results and how it is going with PAP treatment at home. Please advise patient to bring Her machine at the time of the first FU visit, even though this is cumbersome. Bringing the machine for every visit after that will likely not be needed, but often helps for the first visit to troubleshoot if needed. Please re-enforce the importance of compliance with treatment and the need for [place] to monitor compliance data - often an insurance requirement and actually good feedback for the patient as far as how they are doing.  Also remind patient, that any interim PAP machine or mask issues should be first addressed with the DME company, as they can often help better with technical and mask fit issues. Please ask if patient has a preference regarding DME company.  Please also make sure, the patient has a follow-up appointment with me in about  10 weeks from the setup date, thanks.  Once you have spoken to the patient - and faxed/routed report to PCP and referring MD (if other than PCP), you can close this encounter, thanks,   Star Age, MD, PhD [facility] ([place])

## 2017-03-27 NOTE — Telephone Encounter (Signed)
-----   Message from Star Age, MD sent at 03/27/2017  8:11 AM EDT ----- Patient referred by Dr. Jaynee Eagles, seen by me on 09/11/15, then on 03/10/17, diagnostic PSG on 10/18/15, CPAP study on 03/23/17.   Please call and inform patient that I have entered an order for treatment with positive airway pressure (PAP) treatment of obstructive sleep apnea (OSA). She did well during the latest sleep study with CPAP. We will, therefore, arrange for a machine for home use through a DME (durable medical equipment) company of Her choice; and I will see the patient back in follow-up in about 10 weeks. Please also explain to the patient that I will be looking out for compliance data, which can be downloaded from the machine (stored on an SD card, that is inserted in the machine) or via remote access through a modem, that is built into the machine. At the time of the followup appointment we will discuss sleep study results and how it is going with PAP treatment at home. Please advise patient to bring Her machine at the time of the first FU visit, even though this is cumbersome. Bringing the machine for every visit after that will likely not be needed, but often helps for the first visit to troubleshoot if needed. Please re-enforce the importance of compliance with treatment and the need for [place] to monitor compliance data - often an insurance requirement and actually good feedback for the patient as far as how they are doing.  Also remind patient, that any interim PAP machine or mask issues should be first addressed with the DME company, as they can often help better with technical and mask fit issues. Please ask if patient has a preference regarding DME company.  Please also make sure, the patient has a follow-up appointment with me in about  10 weeks from the setup date, thanks.  Once you have spoken to the patient - and faxed/routed report to PCP and referring MD (if other than PCP), you can close this encounter, thanks,   Star Age, MD, PhD [facility] ([place])

## 2017-03-27 NOTE — Procedures (Signed)
PATIENT'S NAME:  Angie Powell, Angie Powell DOB:      [birth year]      MR#:    [id number]     DATE OF RECORDING: 03/23/2017 REFERRING M.D.:  Vic Blackbird, MD Study Performed:   CPAP  Titration HISTORY: 51 year old woman with a history of reflux disease, asthma, depression, arthritis, recurrent headaches and obesity, who returns for a full night CPAP titration to treat her OSA. Her baseline sleep study on 10/18/2015 showed an AHI of 16.1/hour, REM AHI of 65.5/hour and supine AHI of 23.4/hour, average oxygen saturation was 95%, nadir was 75%. The patient endorsed the Epworth Sleepiness Scale at 1/24 points. The patient's weight 234 pounds with a height of 66 (inches), resulting in a BMI of 37.6 kg/m2.  The patient's neck circumference measured 16 inches.  CURRENT MEDICATIONS: Albuterol, Azelastine, B complex, Beclomethasone, Mometasone, Montelukast, Omeprazole-Sodium, Triamcinolone, Vitamin C, Vitamin D, Vitamin E.    PROCEDURE:  This is a multichannel digital polysomnogram utilizing the SomnoStar 11.2 system.  Electrodes and sensors were applied and monitored per AASM Specifications.   EEG, EOG, Chin and Limb EMG, were sampled at 200 Hz.  ECG, Snore and Nasal Pressure, Thermal Airflow, Respiratory Effort, CPAP Flow and Pressure, Oximetry was sampled at 50 Hz. Digital video and audio were recorded.      The patient was fitted with a medium P10 nasal pillows. CPAP was initiated at 5 cmH20 with heated humidity per AASM standards and pressure was advanced to 8 cmH20 because of hypopneas, apneas and desaturations.  At a PAP pressure of 8 cmH20, there was a reduction of the AHI to 0/hour, O2 nadir of 91% and non-supine REM achieved.    Lights Out was at 22:38 and Lights On at 05:00. Total recording time (TRT) was 382 minutes, with a total sleep time (TST) of 315 minutes. The patient's sleep latency was 58 minutes, which is delayed, REM latency was 44 minutes, which is mildly reduced.  The sleep efficiency was 82.5 %.     SLEEP ARCHITECTURE: WASO (Wake after sleep onset) was 9 minutes, with minimal sleep fragmentation.  There were 11.5 minutes in Stage N1, 187.5 minutes Stage N2, 43 minutes Stage N3 and 73 minutes in Stage REM.  The percentage of Stage N1 was 3.7%, Stage N2 was 59.5%, which is mildly increased, Stage N3 was 13.7%, which is near normal and Stage R (REM sleep) was 23.2%, which is normal.  The arousals were noted as: 33 were spontaneous, 1 were associated with PLMs, 10 were associated with respiratory events.  Audio and video analysis did not show any abnormal or unusual movements, behaviors, phonations or vocalizations. The patient took no bathroom breaks. The EKG was in keeping with normal sinus rhythm (NSR).  RESPIRATORY ANALYSIS:  There was a total of 10 respiratory events: 0 obstructive apneas, 0 central apneas and 0 mixed apneas with a total of 0 apneas and an apnea index (AI) of 0 /hour. There were 10 hypopneas with a hypopnea index of 1.9/hour. The patient also had 0 respiratory event related arousals (RERAs).      The total APNEA/HYPOPNEA INDEX  (AHI) was 1.9 /hour and the total RESPIRATORY DISTURBANCE INDEX was 1.9 .hour  8 events occurred in REM sleep and 2 events in NREM. The REM AHI was 6.6 /hour versus a non-REM AHI of .5 /hour.  The patient spent 124.5 minutes of total sleep time in the supine position and 191 minutes in non-supine. The supine AHI was 3.4, versus a non-supine AHI of  0.9.  OXYGEN SATURATION & C02:  The baseline 02 saturation was 96%, with the lowest being 89%. Time spent below 89% saturation equaled 0 minutes.  PERIODIC LIMB MOVEMENTS: The patient had a total of 16 Periodic Limb Movements. The Periodic Limb Movement (PLM) index was 3. and the PLM Arousal index was .2 /hour.  Post-study, the patient indicated that sleep was worse than usual.   DIAGNOSIS  1. Obstructive Sleep Apnea (OSA)   PLANS/RECOMMENDATIONS:  1. This study demonstrates resolution of the patient's  obstructive sleep apnea with CPAP therapy. I will, therefore, start the patient on home CPAP treatment at a pressure of 8 cm via medium nasal pillows with heated humidity. The patient should be reminded to be fully compliant with PAP therapy to improve sleep related symptoms and decrease long term cardiovascular risks. The patient should be reminded, that it may take up to 3 months to get fully used to using PAP with all planned sleep. The earlier full compliance is achieved, the better long term compliance tends to be. Please note that untreated obstructive sleep apnea carries additional perioperative morbidity. Patients with significant obstructive sleep apnea should receive perioperative PAP therapy and the surgeons and particularly the anesthesiologist should be informed of the diagnosis and the severity of the sleep disordered breathing. 2. The patient should be cautioned not to drive, work at heights, or operate dangerous or heavy equipment when tired or sleepy. Review and reiteration of good sleep hygiene measures should be pursued with any patient. 3. The patient will be seen in follow-up by Dr. Rexene Alberts at [facility] for discussion of the test results and further management strategies. The referring provider will be notified of the test results.  I certify that I have reviewed the entire raw data recording prior to the issuance of this report in accordance with the Standards of Accreditation of the American Academy of Sleep Medicine (AASM)     Star Age, MD, PhD Diplomat, American Board of Psychiatry and Neurology (Neurology and Sleep Medicine)

## 2017-03-27 NOTE — Telephone Encounter (Signed)
I spoke to patient and she is aware of results and recommendations. She is willing to start treatment. I will send orders to [facility] in [place] (closest to patient). I will send report to PCP. Patient was able to make f/u appt.

## 2017-03-27 NOTE — Addendum Note (Signed)
Addended by: Star Age on: 03/27/2017 08:11 AM   Modules accepted: Orders

## 2017-04-03 DIAGNOSIS — G4733 Obstructive sleep apnea (adult) (pediatric): Secondary | ICD-10-CM | POA: Diagnosis not present

## 2017-04-11 DIAGNOSIS — J3089 Other allergic rhinitis: Secondary | ICD-10-CM | POA: Diagnosis not present

## 2017-04-11 DIAGNOSIS — J4540 Moderate persistent asthma, uncomplicated: Secondary | ICD-10-CM | POA: Diagnosis not present

## 2017-04-11 DIAGNOSIS — J301 Allergic rhinitis due to pollen: Secondary | ICD-10-CM | POA: Diagnosis not present

## 2017-04-18 DIAGNOSIS — J3089 Other allergic rhinitis: Secondary | ICD-10-CM | POA: Diagnosis not present

## 2017-04-24 DIAGNOSIS — J301 Allergic rhinitis due to pollen: Secondary | ICD-10-CM | POA: Diagnosis not present

## 2017-04-24 DIAGNOSIS — J3089 Other allergic rhinitis: Secondary | ICD-10-CM | POA: Diagnosis not present

## 2017-05-01 DIAGNOSIS — J3089 Other allergic rhinitis: Secondary | ICD-10-CM | POA: Diagnosis not present

## 2017-05-01 DIAGNOSIS — J301 Allergic rhinitis due to pollen: Secondary | ICD-10-CM | POA: Diagnosis not present

## 2017-05-04 DIAGNOSIS — G4733 Obstructive sleep apnea (adult) (pediatric): Secondary | ICD-10-CM | POA: Diagnosis not present

## 2017-05-22 DIAGNOSIS — J3089 Other allergic rhinitis: Secondary | ICD-10-CM | POA: Diagnosis not present

## 2017-05-22 DIAGNOSIS — J301 Allergic rhinitis due to pollen: Secondary | ICD-10-CM | POA: Diagnosis not present

## 2017-05-29 DIAGNOSIS — J301 Allergic rhinitis due to pollen: Secondary | ICD-10-CM | POA: Diagnosis not present

## 2017-05-29 DIAGNOSIS — J3089 Other allergic rhinitis: Secondary | ICD-10-CM | POA: Diagnosis not present

## 2017-06-03 DIAGNOSIS — G4733 Obstructive sleep apnea (adult) (pediatric): Secondary | ICD-10-CM | POA: Diagnosis not present

## 2017-06-05 DIAGNOSIS — R0981 Nasal congestion: Secondary | ICD-10-CM | POA: Diagnosis not present

## 2017-06-05 DIAGNOSIS — J069 Acute upper respiratory infection, unspecified: Secondary | ICD-10-CM | POA: Diagnosis not present

## 2017-06-19 DIAGNOSIS — J3089 Other allergic rhinitis: Secondary | ICD-10-CM | POA: Diagnosis not present

## 2017-06-19 DIAGNOSIS — Z1231 Encounter for screening mammogram for malignant neoplasm of breast: Secondary | ICD-10-CM | POA: Diagnosis not present

## 2017-06-19 DIAGNOSIS — J301 Allergic rhinitis due to pollen: Secondary | ICD-10-CM | POA: Diagnosis not present

## 2017-06-23 ENCOUNTER — Other Ambulatory Visit: Payer: Self-pay | Admitting: Otolaryngology

## 2017-06-23 DIAGNOSIS — E041 Nontoxic single thyroid nodule: Secondary | ICD-10-CM

## 2017-06-23 DIAGNOSIS — J301 Allergic rhinitis due to pollen: Secondary | ICD-10-CM | POA: Insufficient documentation

## 2017-06-23 DIAGNOSIS — Z9989 Dependence on other enabling machines and devices: Secondary | ICD-10-CM

## 2017-06-23 DIAGNOSIS — G4733 Obstructive sleep apnea (adult) (pediatric): Secondary | ICD-10-CM | POA: Diagnosis not present

## 2017-06-26 DIAGNOSIS — J301 Allergic rhinitis due to pollen: Secondary | ICD-10-CM | POA: Diagnosis not present

## 2017-06-26 DIAGNOSIS — Z6837 Body mass index (BMI) 37.0-37.9, adult: Secondary | ICD-10-CM | POA: Diagnosis not present

## 2017-06-26 DIAGNOSIS — Z01419 Encounter for gynecological examination (general) (routine) without abnormal findings: Secondary | ICD-10-CM | POA: Diagnosis not present

## 2017-06-26 DIAGNOSIS — J3089 Other allergic rhinitis: Secondary | ICD-10-CM | POA: Diagnosis not present

## 2017-07-03 ENCOUNTER — Ambulatory Visit: Payer: Self-pay | Admitting: Neurology

## 2017-07-03 ENCOUNTER — Encounter (INDEPENDENT_AMBULATORY_CARE_PROVIDER_SITE_OTHER): Payer: Self-pay

## 2017-07-03 ENCOUNTER — Ambulatory Visit
Admission: RE | Admit: 2017-07-03 | Discharge: 2017-07-03 | Disposition: A | Discharge status: Home / Self Care | Payer: BLUE CROSS/BLUE SHIELD | Source: Ambulatory Visit | Attending: Otolaryngology | Admitting: Otolaryngology

## 2017-07-03 ENCOUNTER — Encounter: Payer: Self-pay | Admitting: Neurology

## 2017-07-03 VITALS — BP 113/76 | HR 92 | Wt 242.0 lb

## 2017-07-03 DIAGNOSIS — Z9989 Dependence on other enabling machines and devices: Secondary | ICD-10-CM | POA: Diagnosis not present

## 2017-07-03 DIAGNOSIS — G4733 Obstructive sleep apnea (adult) (pediatric): Secondary | ICD-10-CM | POA: Diagnosis not present

## 2017-07-03 DIAGNOSIS — E042 Nontoxic multinodular goiter: Secondary | ICD-10-CM | POA: Diagnosis not present

## 2017-07-03 DIAGNOSIS — E041 Nontoxic single thyroid nodule: Secondary | ICD-10-CM

## 2017-07-03 NOTE — Progress Notes (Signed)
Subjective:    Patient ID: Angie Powell is a 51 y.o. female.  HPI     Interim history:   Angie Powell is a 51 year old right-handed woman with an underlying medical history of reflux disease, asthma, depression, arthritis, recurrent headaches and obesity, who presents for follow-up consultation of her obstructive sleep apnea, after her recent CPAP titration study. The patient is unaccompanied today. I last saw her on 03/10/2017, at which time she reported unchanged sleep-related symptoms and recurrent headaches. She had not seen Dr. Jaynee Powell in follow-up either. I suggested she return for a full night CPAP titration study since her baseline sleep study from December 2016 showed moderate to severe OSA. She had a CPAP titration on 03/23/2017. I went over her test results with her in detail today. Sleep efficiency was 82.5%, sleep latency 58 minutes, REM latency 44 minutes. She had an increased percentage of stage II sleep, near normal percentage of slow-wave sleep and REM sleep was normal at 23.2%. CPAP was initiated at 5 cm and titrated to 8 cm. She was fitted with medium nasal pillows and AHI was reduced to 0 per hour on the final pressure, O2 nadir of 91% with nonsupine REM sleep achieved. Average oxygen saturation was 96%, nadir was 89%. She had no significant PLMS or EKG or EEG changes. Based on her test results I prescribed CPAP therapy for home use.  Today, 07/03/2017 (all dictated new, as well as above notes, some dictation done in note pad or Word, outside of chart, may appear as copied):  I reviewed her CPAP compliance data from 06/03/2017 through 07/02/2017 which is a total of 30 days, during which time she used her CPAP 24 days with percent used days greater than 4 hours at 63%, indicating suboptimal compliance with an average usage of 5 hours and 20 minutes, residual AHI 1 per hour, leak low with the 95th percentile at 3.5 L/m on a pressure of 8 cm with EPR of 2. She reports that she did really  well in the first month or so after starting CPAP therapy. She felt much improved with regards to her headaches but lately she had had some difficulty maintaining sleep and difficulty going to sleep and some nights she felt she just could not be bothered with the CPAP. In fact, looking at her CPAP compliance download from 05/12/2017 through 06/10/2017 which is a total of 30 days, during which time her compliance percentage for more than 4 hours was 80%, average usage of 6 hours and 17 minutes, residual AHI 1.2 per hour, leak for the 95th percentile at 6.7 L/m. Her 90 day compliance from 04/04/2017 through 07/02/2017 was 78%.   The patient's allergies, current medications, family history, past medical history, past social history, past surgical history and problem list were reviewed and updated as appropriate.    Previously (copied from previous notes for reference):   I first met her on 09/11/2015 at the request of Dr. Jaynee Powell, at which time she reported morning headaches, nonrestorative sleep and nocturia. She was invited for a sleep study. She had a baseline sleep study on 10/18/2015. She had a sleep efficiency of 90.8%, sleep latency was 17.5 minutes, REM latency was 64 minutes. She had an increased percentage of stage II sleep, decreased percentage of slow-wave sleep and REM sleep was 17.7%. She had mild to moderate snoring. She had a total AHI of 16.1 per hour, rising to 65.5 per hour during REM sleep and 23.4 per hour in the supine position.  Average oxygen saturation was 95%, nadir was 75%, time below 88% saturation was 5 minutes and 56 seconds for the study. She was advised to proceed with a CPAP titration study. She did not have this done and did not return for follow-up.    09/11/2015: She reports morning headaches and non-restorative sleep. She has an ESS of 3/24 today, and her fatigue score is 31 out of 63. I reviewed your office note from 08/17/2015. She started topamax and c/o loss of appetite,  she takes it infrequently, maybe every other day, and reports improvement of her headaches since she started topiramate.  She reports nocturia usually once per night. She has occasional RLS symptoms but is not sure if she kicks her legs in her sleep. 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 She drinks caffeine in the form of coffee, 1 cup per day. She goes to bed around 11 PM and rise time 7 AM and she has a FHx of OSA in her father, who has a CPAP machine. She has swelling of her legs chronically and has seen a vascular and pain specialist. She is supposed to make a follow-up appointment.  she does not drink alcohol or smoke cigarettes.     Her Past Medical History Is Significant For: Past Medical History:  Diagnosis Date  . Arthritis   . Asthma   . Benign neoplasm of thyroid   . Depression   . Family history of diabetes mellitus   . GERD (gastroesophageal reflux disease)   . Heartburn   . Obesity   . OSA (obstructive sleep apnea)   . Right foot pain   . Tibialis tendinitis     Her Past Surgical History Is Significant For: Past Surgical History:  Procedure Laterality Date  . APPENDECTOMY    . right thyroidectomy, benign disease  06/2008  . TONSILLECTOMY    . TUBAL LIGATION      Her Family History Is Significant For: Family History  Problem Relation Age of Onset  . Cancer Mother        parathyroid   . Diabetes Mother   . Hyperlipidemia Mother   . Hypertension Mother   . Heart disease Maternal Grandmother   . Alcohol abuse Neg Hx   . Drug abuse Neg Hx   . Bipolar disorder Neg Hx   . Anxiety disorder Neg Hx   . Dementia Neg Hx   . Depression Neg Hx   . OCD Neg Hx   . Paranoid behavior Neg Hx   . Schizophrenia Neg Hx   . Seizures Neg Hx   . Sexual abuse Neg Hx   . Physical abuse Neg Hx   . Migraines Neg Hx     Her Social History Is Significant For: Social History   Social History  . Marital status: Single    Spouse name: N/A  . Number of children: 1   . Years of education: 56   Occupational History  . Writer    Social History Main Topics  . Smoking status: Never Smoker  . Smokeless tobacco: Never Used  . Alcohol use No  . Drug use: No  . Sexual activity: Yes   Other Topics Concern  . None   Social History Narrative   Lives at home with daughter.   Caffeine use:  4 cup daily    Her Allergies Are:  No Known Allergies:   Her Current Medications Are:  Outpatient Encounter Prescriptions as of 07/03/2017  Medication  Sig  . albuterol (PROVENTIL HFA) 108 (90 BASE) MCG/ACT inhaler Inhale 2 puffs into the lungs every 4 (four) hours as needed for wheezing.  Marland Kitchen azelastine (ASTELIN) 0.1 % nasal spray Place into the nose.  Marland Kitchen azelastine (ASTEPRO) 137 MCG/SPRAY nasal spray 2 sprays by Nasal route 2 (two) times daily. Use in each nostril as directed   . b complex vitamins tablet Take 1 tablet by mouth daily.  . beclomethasone (QVAR) 40 MCG/ACT inhaler Inhale 2 puffs into the lungs 2 (two) times daily.  . mometasone (ELOCON) 0.1 % cream Apply 1 application topically 2 (two) times daily.  . montelukast (SINGULAIR) 10 MG tablet Take 10 mg by mouth at bedtime.    Marland Kitchen omeprazole-sodium bicarbonate (ZEGERID) 40-1100 MG per capsule Take 1 capsule by mouth daily before breakfast.    . triamcinolone (NASACORT) 55 MCG/ACT AERO nasal inhaler Place 2 sprays into the nose daily as needed.  . valACYclovir (VALTREX) 500 MG tablet take 1 tablet by mouth twice a day  . vitamin C (ASCORBIC ACID) 500 MG tablet Take 500 mg by mouth daily.  . Vitamin D, Ergocalciferol, (DRISDOL) 50000 UNITS CAPS capsule Take 1 capsule (50,000 Units total) by mouth every 7 (seven) days.  . vitamin E 200 UNIT capsule Take 200 Units by mouth daily.  . [DISCONTINUED] albuterol (PROVENTIL HFA;VENTOLIN HFA) 108 (90 Base) MCG/ACT inhaler Inhale into the lungs.  . [DISCONTINUED] montelukast (SINGULAIR) 10 MG tablet Take by mouth.  . [DISCONTINUED] vitamin C (ASCORBIC ACID) 500 MG  tablet Take by mouth.  . [DISCONTINUED] Vitamin D, Ergocalciferol, (DRISDOL) 50000 units CAPS capsule Take by mouth.   No facility-administered encounter medications on file as of 07/03/2017.   :  Review of Systems:  Out of a complete 14 point review of systems, all are reviewed and negative with the exception of these symptoms as listed below: Review of Systems  Constitutional: Negative.   HENT: Negative.   Eyes: Negative.   Respiratory: Negative.   Cardiovascular: Negative.   Gastrointestinal: Negative.   Endocrine: Negative.   Genitourinary: Negative.   Musculoskeletal: Negative.   Skin: Negative.   Allergic/Immunologic: Negative.   Neurological: Negative.   Hematological: Negative.   Psychiatric/Behavioral: Positive for sleep disturbance.    Objective:  Neurological Exam  Physical Exam Physical Examination:   Vitals:   07/03/17 1412  BP: 113/76  Pulse: 92    General Examination: The patient is a very pleasant 51 y.o. female in no acute distress. She appears well-developed and well-nourished and well groomed.   HEENT:Normocephalic, atraumatic, pupils are equal, round and reactive to light and accommodation. Extraocular tracking is good without limitation to gaze excursion or nystagmus noted. Normal smooth pursuit is noted. Hearing is grossly intact. Face is symmetric with normal facial animation and normal facial sensation. Speech is clear with no dysarthria noted. There is no hypophonia. There is no lip, neck/head, jaw or voice tremor. Neck is supple with full range of passive and active motion. There are no carotid bruits on auscultation. Oropharynx exam reveals: mild mouth dryness, good dental hygiene and moderate airway crowding. No actual nostrils sore, mildly irritated area at the base of the left nostril.   Chest:Clear to auscultation without wheezing, rhonchi or crackles noted.  Heart:S1+S2+0, regular and normal without murmurs, rubs or gallops noted.    Abdomen:Soft, non-tender and non-distended with normal bowel sounds appreciated on auscultation.  Extremities:There is 1+ non-pitting edema in the distal lower extremities bilaterally, is wearing bilateral compression socks to the knees.  Skin: Warm and dry without trophic changes noted.   Musculoskeletal: exam reveals no obvious joint deformities, tenderness or joint swelling or erythema.   Neurologically:  Mental status: The patient is awake, alert and oriented in all 4 spheres. Her immediate and remote memory, attention, language skills and fund of knowledge are appropriate. There is no evidence of aphasia, agnosia, apraxia or anomia. Speech is clear with normal prosody and enunciation. Thought process is linear. Mood is normaland affect is normal.  Cranial nerves II - XII are as described above under HEENT exam.  Motor exam: Normal bulk, strength and tone is noted. There is no drift, tremor or rebound. Romberg is negative. Reflexes are 1+ throughout. Fine motor skills and coordination: Grossly intact. Cerebellar testing shows no dysmetria or intention tremor. She has no truncal or gait ataxia. Sensory exam: intact to light touch in the upper and lower extremities.  Gait, station and balance: She stands easily. No veering to one side is noted. No leaning to one side is noted. Posture is age-appropriate and stance is narrow based. Gait shows normalstride length and normalpace.   Assessment and Plan:  In summary, Brinley H Jonesis a very pleasant 51 year old female with an underlying medical history of reflux disease, asthma, depression, arthritis, and lower extremity swelling, recurrent headaches and obesity, who presents for follow-up consultation of her moderate to severe obstructive sleep apneaAfter her recent CPAP titration study and starting CPAP therapy. We talked about her titration study results in detail today. We reviewed compliance data from the past 30 days and also from  when she started treatment. She indicated great results in the first month or so but lately has had some difficulty with sleep onset and sleep maintenance and has also skipped days. She is encouraged to keep working on this. She had done well and also felt improved in the beginning and is encouraged to be fully compliant with treatment. We will try a nasal mask and I placed an order for this. Furthermore, she is encouraged to try over-the-counter melatonin to help with her sleep onset difficulties. I suggested a six-month follow-up with one of our nurse practitioners. I answered all her questions today and she was in agreement.

## 2017-07-03 NOTE — Patient Instructions (Addendum)
Please continue using your CPAP regularly. While your insurance requires that you use CPAP at least 4 hours each night on 70% of the nights, I recommend, that you not skip any nights and use it throughout the night if you can. Getting used to CPAP and staying with the treatment long term does take time and patience and discipline. Untreated obstructive sleep apnea when it is moderate to severe can have an adverse impact on cardiovascular health and raise her risk for heart disease, arrhythmias, hypertension, congestive heart failure, stroke and diabetes. Untreated obstructive sleep apnea causes sleep disruption, nonrestorative sleep, and sleep deprivation. This can have an impact on your day to day functioning and cause daytime sleepiness and impairment of cognitive function, memory loss, mood disturbance, and problems focussing. Using CPAP regularly can improve these symptoms.  You can try Melatonin at night for sleep: take 1 mg to 3 mg, one to 2 hours before your bedtime. You can go up to 5 mg if needed. It is over the counter and comes in pill form, chewable form and spray, if you prefer.    We can see you in about 6 months, you can see one of our nurse practitioners.

## 2017-07-04 DIAGNOSIS — G4733 Obstructive sleep apnea (adult) (pediatric): Secondary | ICD-10-CM | POA: Diagnosis not present

## 2017-07-09 ENCOUNTER — Other Ambulatory Visit (HOSPITAL_COMMUNITY): Payer: Self-pay | Admitting: Otolaryngology

## 2017-07-09 DIAGNOSIS — E041 Nontoxic single thyroid nodule: Secondary | ICD-10-CM

## 2017-07-10 DIAGNOSIS — J3089 Other allergic rhinitis: Secondary | ICD-10-CM | POA: Diagnosis not present

## 2017-07-10 DIAGNOSIS — J301 Allergic rhinitis due to pollen: Secondary | ICD-10-CM | POA: Diagnosis not present

## 2017-07-11 ENCOUNTER — Ambulatory Visit (INDEPENDENT_AMBULATORY_CARE_PROVIDER_SITE_OTHER): Payer: BLUE CROSS/BLUE SHIELD | Admitting: Family Medicine

## 2017-07-11 ENCOUNTER — Telehealth: Payer: Self-pay | Admitting: *Deleted

## 2017-07-11 ENCOUNTER — Ambulatory Visit: Payer: BLUE CROSS/BLUE SHIELD | Admitting: Family Medicine

## 2017-07-11 ENCOUNTER — Encounter: Payer: Self-pay | Admitting: Family Medicine

## 2017-07-11 VITALS — BP 122/74 | HR 90 | Temp 98.5°F | Resp 16 | Wt 243.0 lb

## 2017-07-11 DIAGNOSIS — E559 Vitamin D deficiency, unspecified: Secondary | ICD-10-CM

## 2017-07-11 DIAGNOSIS — R609 Edema, unspecified: Secondary | ICD-10-CM

## 2017-07-11 DIAGNOSIS — R5383 Other fatigue: Secondary | ICD-10-CM | POA: Diagnosis not present

## 2017-07-11 DIAGNOSIS — E041 Nontoxic single thyroid nodule: Secondary | ICD-10-CM | POA: Diagnosis not present

## 2017-07-11 MED ORDER — MOMETASONE FUROATE 0.1 % EX CREA: 1.0000 "application " | TOPICAL_CREAM | Freq: Two times a day (BID) | CUTANEOUS | 3 refills | Status: DC

## 2017-07-11 MED ORDER — FUROSEMIDE 20 MG PO TABS: 20.0000 mg | ORAL_TABLET | Freq: Every day | ORAL | 3 refills | Status: DC | PRN

## 2017-07-11 NOTE — Telephone Encounter (Signed)
Prescription sent to pharmacy.

## 2017-07-11 NOTE — Assessment & Plan Note (Addendum)
Trial of lasix 20mg  daily for her lower extremeites.   Cause of her fatigue may be multifactorial but she does have thyroid nodules which is an increase in size therefore prompting her need for biopsy. I will check her thyroid function studies metabolic panel with check for anemia will also check for vitamin deficiencies with her symptoms. There is no overt sign of any heart failure or acute illness today. Her weight is also contributing her BMI is at 39. I think significant weight loss and also help her symptoms.  She does admit to some stress with her home life but states that she is re: make changes today decrease some of the problems.

## 2017-07-11 NOTE — Patient Instructions (Signed)
Try the lasix once a day as needed F/U pending results

## 2017-07-11 NOTE — Progress Notes (Signed)
Subjective:    Patient ID: Angie Powell, female    DOB: July 19, 1966, 51 y.o.   MRN: [id number]  Patient presents for Fatigue   Has not been feeling past few months. Seen by neurology last week, on CPAP for sleep apnea. Has been on melatonoinFor the past month and this helped with her sleep.   Has appt with Dr. Constance Holster for thyroid , planning for biopsy, has not had thyroid function studies drawn or any other labs Has gained 10lbs in the past month she does feel like she has been overeating.  Occasionally has acid reflux- takes OTC meds and this helps    taking vitamins D and B complex    Had cold back in July, seen at [facility] told viral    Started herbal life smoothies recently to help with weight loss   On exam she was noted to have motion of any edema states it is been present for a year and never really goes away.  Review Of Systems:  GEN- +fatigue, fever, weight loss,weakness, recent illness HEENT- denies eye drainage, change in vision, nasal discharge, CVS- denies chest pain, palpitations RESP- denies SOB, cough, wheeze ABD- denies N/V, change in stools, abd pain GU- denies dysuria, hematuria, dribbling, incontinence MSK- denies joint pain, muscle aches, injury Neuro- denies headache, dizziness, syncope, seizure activity       Objective:    BP 122/74   Pulse 90   Temp 98.5 F (36.9 C)   Resp 16   Wt 243 lb (110.2 kg)   SpO2 96%   BMI 39.22 kg/m  GEN- NAD, alert and oriented x3 HEENT- PERRL, EOMI, non injected sclera, pink conjunctiva, MMM, oropharynx clear Neck- Supple, no thyromegaly, no JVD  CVS- RRR, no murmur RESP-CTAB ABD-NABS,soft,NT,ND EXT- Non pitting   Edema  bilatankles lower leg  Psych normal affect and mood  Pulses- Radial, DP- 2+        Assessment & Plan:      Problem List Items Addressed This Visit      Unprioritized   Thyroid nodule   Relevant Orders   TSH   T3, free   T4, free   Vitamin D deficiency (Chronic)    Check Vitamin D  level      Relevant Orders   Vitamin D, 25-hydroxy   Peripheral edema    Trial of lasix 20mg  daily for her lower extremeites.   Cause of her fatigue may be multifactorial but she does have thyroid nodules which is an increase in size therefore prompting her need for biopsy. I will check her thyroid function studies metabolic panel with check for anemia will also check for vitamin deficiencies with her symptoms. There is no overt sign of any heart failure or acute illness today. Her weight is also contributing her BMI is at 39. I think significant weight loss and also help her symptoms.  She does admit to some stress with her home life but states that she is re: make changes today decrease some of the problems.      Relevant Orders   CBC with Differential/Platelet   Comprehensive metabolic panel    Other Visit Diagnoses    Other fatigue    -  Primary   Relevant Orders   CBC with Differential/Platelet   Comprehensive metabolic panel   TSH   T3, free   T4, free   Vitamin B12      Note: This dictation was prepared with Dragon dictation along with smaller phrase technology.  Any transcriptional errors that result from this process are unintentional.

## 2017-07-11 NOTE — Assessment & Plan Note (Signed)
Check Vitamin D level 

## 2017-07-11 NOTE — Telephone Encounter (Signed)
Patient was seen in the office and needs a refill of her Mometasone . Her pharmacy [facility] in [place]. Angie Powell. Please advise. Thank you

## 2017-07-12 LAB — COMPREHENSIVE METABOLIC PANEL
ALT: 17 U/L (ref 6–29)
AST: 20 U/L (ref 10–35)
Albumin: 4.2 g/dL (ref 3.6–5.1)
Alkaline Phosphatase: 87 U/L (ref 33–130)
BILIRUBIN TOTAL: 0.5 mg/dL (ref 0.2–1.2)
BUN: 16 mg/dL (ref 7–25)
CO2: 22 mmol/L (ref 20–32)
CREATININE: 0.89 mg/dL (ref 0.50–1.05)
Calcium: 9.2 mg/dL (ref 8.6–10.4)
Chloride: 103 mmol/L (ref 98–110)
GLUCOSE: 89 mg/dL (ref 70–99)
Potassium: 4.2 mmol/L (ref 3.5–5.3)
SODIUM: 139 mmol/L (ref 135–146)
Total Protein: 6.9 g/dL (ref 6.1–8.1)

## 2017-07-12 LAB — CBC WITH DIFFERENTIAL/PLATELET
BASOS ABS: 76 {cells}/uL (ref 0–200)
Basophils Relative: 1 %
EOS ABS: 152 {cells}/uL (ref 15–500)
Eosinophils Relative: 2 %
HCT: 41.3 % (ref 35.0–45.0)
Hemoglobin: 13.1 g/dL (ref 12.0–15.0)
LYMPHS ABS: 2812 {cells}/uL (ref 850–3900)
LYMPHS PCT: 37 %
MCH: 28.3 pg (ref 27.0–33.0)
MCHC: 31.7 g/dL — ABNORMAL LOW (ref 32.0–36.0)
MCV: 89.2 fL (ref 80.0–100.0)
MONO ABS: 456 {cells}/uL (ref 200–950)
MPV: 10.5 fL (ref 7.5–12.5)
Monocytes Relative: 6 %
NEUTROS PCT: 54 %
Neutro Abs: 4104 cells/uL (ref 1500–7800)
PLATELETS: 334 10*3/uL (ref 140–400)
RBC: 4.63 MIL/uL (ref 3.80–5.10)
RDW: 13.4 % (ref 11.0–15.0)
WBC: 7.6 10*3/uL (ref 3.8–10.8)

## 2017-07-12 LAB — TSH: TSH: 1.12 mIU/L

## 2017-07-12 LAB — T3, FREE: T3, Free: 3 pg/mL (ref 2.3–4.2)

## 2017-07-12 LAB — T4, FREE: FREE T4: 0.9 ng/dL (ref 0.8–1.8)

## 2017-07-12 LAB — VITAMIN B12: Vitamin B-12: 370 pg/mL (ref 200–1100)

## 2017-07-12 LAB — VITAMIN D 25 HYDROXY (VIT D DEFICIENCY, FRACTURES): Vit D, 25-Hydroxy: 14 ng/mL — ABNORMAL LOW (ref 30–100)

## 2017-07-16 ENCOUNTER — Other Ambulatory Visit: Payer: Self-pay | Admitting: *Deleted

## 2017-07-16 ENCOUNTER — Other Ambulatory Visit: Payer: Self-pay | Admitting: Otolaryngology

## 2017-07-16 DIAGNOSIS — E041 Nontoxic single thyroid nodule: Secondary | ICD-10-CM

## 2017-07-16 MED ORDER — VITAMIN B-12 1000 MCG PO TABS: 1000.0000 ug | ORAL_TABLET | Freq: Every day | ORAL | Status: DC

## 2017-07-16 MED ORDER — VITAMIN D (ERGOCALCIFEROL) 1.25 MG (50000 UNIT) PO CAPS: 50000.0000 [IU] | ORAL_CAPSULE | ORAL | 0 refills | Status: DC

## 2017-07-17 DIAGNOSIS — J301 Allergic rhinitis due to pollen: Secondary | ICD-10-CM | POA: Diagnosis not present

## 2017-07-17 DIAGNOSIS — J3089 Other allergic rhinitis: Secondary | ICD-10-CM | POA: Diagnosis not present

## 2017-07-24 ENCOUNTER — Other Ambulatory Visit (HOSPITAL_COMMUNITY)
Admission: RE | Admit: 2017-07-24 | Discharge: 2017-07-24 | Disposition: A | Discharge status: Home / Self Care | Payer: BLUE CROSS/BLUE SHIELD | Source: Ambulatory Visit | Attending: Student | Admitting: Student

## 2017-07-24 ENCOUNTER — Ambulatory Visit
Admission: RE | Admit: 2017-07-24 | Discharge: 2017-07-24 | Disposition: A | Discharge status: Home / Self Care | Payer: BLUE CROSS/BLUE SHIELD | Source: Ambulatory Visit | Attending: Otolaryngology | Admitting: Otolaryngology

## 2017-07-24 DIAGNOSIS — E041 Nontoxic single thyroid nodule: Secondary | ICD-10-CM

## 2017-07-24 DIAGNOSIS — R946 Abnormal results of thyroid function studies: Secondary | ICD-10-CM | POA: Diagnosis not present

## 2017-08-01 DIAGNOSIS — J0100 Acute maxillary sinusitis, unspecified: Secondary | ICD-10-CM | POA: Diagnosis not present

## 2017-08-01 DIAGNOSIS — J301 Allergic rhinitis due to pollen: Secondary | ICD-10-CM | POA: Diagnosis not present

## 2017-08-01 DIAGNOSIS — J3089 Other allergic rhinitis: Secondary | ICD-10-CM | POA: Diagnosis not present

## 2017-08-01 DIAGNOSIS — J4540 Moderate persistent asthma, uncomplicated: Secondary | ICD-10-CM | POA: Diagnosis not present

## 2017-08-04 DIAGNOSIS — G4733 Obstructive sleep apnea (adult) (pediatric): Secondary | ICD-10-CM | POA: Diagnosis not present

## 2017-08-14 DIAGNOSIS — J3089 Other allergic rhinitis: Secondary | ICD-10-CM | POA: Diagnosis not present

## 2017-08-14 DIAGNOSIS — J301 Allergic rhinitis due to pollen: Secondary | ICD-10-CM | POA: Diagnosis not present

## 2017-08-28 DIAGNOSIS — J301 Allergic rhinitis due to pollen: Secondary | ICD-10-CM | POA: Diagnosis not present

## 2017-08-28 DIAGNOSIS — J3089 Other allergic rhinitis: Secondary | ICD-10-CM | POA: Diagnosis not present

## 2017-09-18 DIAGNOSIS — J301 Allergic rhinitis due to pollen: Secondary | ICD-10-CM | POA: Diagnosis not present

## 2017-09-18 DIAGNOSIS — J3089 Other allergic rhinitis: Secondary | ICD-10-CM | POA: Diagnosis not present

## 2017-09-25 DIAGNOSIS — J301 Allergic rhinitis due to pollen: Secondary | ICD-10-CM | POA: Diagnosis not present

## 2017-09-25 DIAGNOSIS — J3089 Other allergic rhinitis: Secondary | ICD-10-CM | POA: Diagnosis not present

## 2017-09-30 DIAGNOSIS — J301 Allergic rhinitis due to pollen: Secondary | ICD-10-CM | POA: Diagnosis not present

## 2017-09-30 DIAGNOSIS — J3089 Other allergic rhinitis: Secondary | ICD-10-CM | POA: Diagnosis not present

## 2017-10-06 ENCOUNTER — Other Ambulatory Visit: Payer: Self-pay | Admitting: Family Medicine

## 2017-10-06 NOTE — Telephone Encounter (Signed)
High dose Vit d was for only 12 weeks, pt can now take OTC dose, refill denied

## 2017-10-09 DIAGNOSIS — J301 Allergic rhinitis due to pollen: Secondary | ICD-10-CM | POA: Diagnosis not present

## 2017-10-09 DIAGNOSIS — J3089 Other allergic rhinitis: Secondary | ICD-10-CM | POA: Diagnosis not present

## 2017-10-13 DIAGNOSIS — J019 Acute sinusitis, unspecified: Secondary | ICD-10-CM | POA: Diagnosis not present

## 2017-10-13 DIAGNOSIS — B9689 Other specified bacterial agents as the cause of diseases classified elsewhere: Secondary | ICD-10-CM | POA: Diagnosis not present

## 2017-10-16 ENCOUNTER — Other Ambulatory Visit: Payer: Self-pay | Admitting: *Deleted

## 2017-10-16 MED ORDER — VITAMIN D 50 MCG (2000 UT) PO TABS: 2000.0000 [IU] | ORAL_TABLET | Freq: Every day | ORAL | 11 refills | Status: DC

## 2017-10-17 DIAGNOSIS — F331 Major depressive disorder, recurrent, moderate: Secondary | ICD-10-CM | POA: Diagnosis not present

## 2017-10-23 DIAGNOSIS — J3089 Other allergic rhinitis: Secondary | ICD-10-CM | POA: Diagnosis not present

## 2017-10-23 DIAGNOSIS — J301 Allergic rhinitis due to pollen: Secondary | ICD-10-CM | POA: Diagnosis not present

## 2017-10-28 DIAGNOSIS — J301 Allergic rhinitis due to pollen: Secondary | ICD-10-CM | POA: Diagnosis not present

## 2017-10-28 DIAGNOSIS — J3089 Other allergic rhinitis: Secondary | ICD-10-CM | POA: Diagnosis not present

## 2017-10-30 DIAGNOSIS — J3089 Other allergic rhinitis: Secondary | ICD-10-CM | POA: Diagnosis not present

## 2017-10-30 DIAGNOSIS — J301 Allergic rhinitis due to pollen: Secondary | ICD-10-CM | POA: Diagnosis not present

## 2017-11-07 DIAGNOSIS — J301 Allergic rhinitis due to pollen: Secondary | ICD-10-CM | POA: Diagnosis not present

## 2017-11-07 DIAGNOSIS — J3089 Other allergic rhinitis: Secondary | ICD-10-CM | POA: Diagnosis not present

## 2017-11-07 DIAGNOSIS — J4540 Moderate persistent asthma, uncomplicated: Secondary | ICD-10-CM | POA: Diagnosis not present

## 2017-11-08 ENCOUNTER — Other Ambulatory Visit: Payer: Self-pay | Admitting: Family Medicine

## 2017-11-13 DIAGNOSIS — J3089 Other allergic rhinitis: Secondary | ICD-10-CM | POA: Diagnosis not present

## 2017-11-13 DIAGNOSIS — J301 Allergic rhinitis due to pollen: Secondary | ICD-10-CM | POA: Diagnosis not present

## 2017-11-21 DIAGNOSIS — J3089 Other allergic rhinitis: Secondary | ICD-10-CM | POA: Diagnosis not present

## 2017-11-21 DIAGNOSIS — J301 Allergic rhinitis due to pollen: Secondary | ICD-10-CM | POA: Diagnosis not present

## 2017-12-04 DIAGNOSIS — J3089 Other allergic rhinitis: Secondary | ICD-10-CM | POA: Diagnosis not present

## 2017-12-04 DIAGNOSIS — J301 Allergic rhinitis due to pollen: Secondary | ICD-10-CM | POA: Diagnosis not present

## 2017-12-19 DIAGNOSIS — J301 Allergic rhinitis due to pollen: Secondary | ICD-10-CM | POA: Diagnosis not present

## 2017-12-19 DIAGNOSIS — J3089 Other allergic rhinitis: Secondary | ICD-10-CM | POA: Diagnosis not present

## 2017-12-25 DIAGNOSIS — J3089 Other allergic rhinitis: Secondary | ICD-10-CM | POA: Diagnosis not present

## 2017-12-25 DIAGNOSIS — J301 Allergic rhinitis due to pollen: Secondary | ICD-10-CM | POA: Diagnosis not present

## 2018-01-01 DIAGNOSIS — J3089 Other allergic rhinitis: Secondary | ICD-10-CM | POA: Diagnosis not present

## 2018-01-07 ENCOUNTER — Other Ambulatory Visit: Payer: Self-pay | Admitting: Radiology

## 2018-01-07 DIAGNOSIS — N6320 Unspecified lump in the left breast, unspecified quadrant: Secondary | ICD-10-CM | POA: Diagnosis not present

## 2018-01-08 ENCOUNTER — Ambulatory Visit: Payer: BLUE CROSS/BLUE SHIELD | Admitting: Adult Health

## 2018-01-14 ENCOUNTER — Other Ambulatory Visit: Payer: Self-pay | Admitting: Radiology

## 2018-01-14 ENCOUNTER — Ambulatory Visit
Admission: RE | Admit: 2018-01-14 | Discharge: 2018-01-14 | Disposition: A | Discharge status: Home / Self Care | Payer: BLUE CROSS/BLUE SHIELD | Source: Ambulatory Visit | Attending: Radiology | Admitting: Radiology

## 2018-01-14 DIAGNOSIS — N6320 Unspecified lump in the left breast, unspecified quadrant: Secondary | ICD-10-CM

## 2018-01-14 DIAGNOSIS — N6310 Unspecified lump in the right breast, unspecified quadrant: Secondary | ICD-10-CM

## 2018-01-14 DIAGNOSIS — R928 Other abnormal and inconclusive findings on diagnostic imaging of breast: Secondary | ICD-10-CM | POA: Diagnosis not present

## 2018-01-14 DIAGNOSIS — N6489 Other specified disorders of breast: Secondary | ICD-10-CM | POA: Diagnosis not present

## 2018-02-02 DIAGNOSIS — J301 Allergic rhinitis due to pollen: Secondary | ICD-10-CM | POA: Diagnosis not present

## 2018-02-03 DIAGNOSIS — J3089 Other allergic rhinitis: Secondary | ICD-10-CM | POA: Diagnosis not present

## 2018-02-05 DIAGNOSIS — J301 Allergic rhinitis due to pollen: Secondary | ICD-10-CM | POA: Diagnosis not present

## 2018-02-12 DIAGNOSIS — J3089 Other allergic rhinitis: Secondary | ICD-10-CM | POA: Diagnosis not present

## 2018-02-12 DIAGNOSIS — J301 Allergic rhinitis due to pollen: Secondary | ICD-10-CM | POA: Diagnosis not present

## 2018-02-19 DIAGNOSIS — J301 Allergic rhinitis due to pollen: Secondary | ICD-10-CM | POA: Diagnosis not present

## 2018-02-19 DIAGNOSIS — J3089 Other allergic rhinitis: Secondary | ICD-10-CM | POA: Diagnosis not present

## 2018-03-02 DIAGNOSIS — R6889 Other general symptoms and signs: Secondary | ICD-10-CM | POA: Diagnosis not present

## 2018-03-02 DIAGNOSIS — J101 Influenza due to other identified influenza virus with other respiratory manifestations: Secondary | ICD-10-CM | POA: Diagnosis not present

## 2018-03-02 DIAGNOSIS — Z8709 Personal history of other diseases of the respiratory system: Secondary | ICD-10-CM | POA: Diagnosis not present

## 2018-03-19 DIAGNOSIS — J301 Allergic rhinitis due to pollen: Secondary | ICD-10-CM | POA: Diagnosis not present

## 2018-03-19 DIAGNOSIS — J3089 Other allergic rhinitis: Secondary | ICD-10-CM | POA: Diagnosis not present

## 2018-03-26 DIAGNOSIS — J3089 Other allergic rhinitis: Secondary | ICD-10-CM | POA: Diagnosis not present

## 2018-03-26 DIAGNOSIS — J301 Allergic rhinitis due to pollen: Secondary | ICD-10-CM | POA: Diagnosis not present

## 2018-04-02 DIAGNOSIS — J3089 Other allergic rhinitis: Secondary | ICD-10-CM | POA: Diagnosis not present

## 2018-04-02 DIAGNOSIS — J301 Allergic rhinitis due to pollen: Secondary | ICD-10-CM | POA: Diagnosis not present

## 2018-04-09 DIAGNOSIS — J301 Allergic rhinitis due to pollen: Secondary | ICD-10-CM | POA: Diagnosis not present

## 2018-04-09 DIAGNOSIS — J3089 Other allergic rhinitis: Secondary | ICD-10-CM | POA: Diagnosis not present

## 2018-04-16 DIAGNOSIS — J301 Allergic rhinitis due to pollen: Secondary | ICD-10-CM | POA: Diagnosis not present

## 2018-04-16 DIAGNOSIS — J3089 Other allergic rhinitis: Secondary | ICD-10-CM | POA: Diagnosis not present

## 2018-05-07 DIAGNOSIS — J301 Allergic rhinitis due to pollen: Secondary | ICD-10-CM | POA: Diagnosis not present

## 2018-05-07 DIAGNOSIS — J3089 Other allergic rhinitis: Secondary | ICD-10-CM | POA: Diagnosis not present

## 2018-05-21 DIAGNOSIS — J3089 Other allergic rhinitis: Secondary | ICD-10-CM | POA: Diagnosis not present

## 2018-05-21 DIAGNOSIS — J301 Allergic rhinitis due to pollen: Secondary | ICD-10-CM | POA: Diagnosis not present

## 2018-05-28 DIAGNOSIS — J3089 Other allergic rhinitis: Secondary | ICD-10-CM | POA: Diagnosis not present

## 2018-05-28 DIAGNOSIS — J301 Allergic rhinitis due to pollen: Secondary | ICD-10-CM | POA: Diagnosis not present

## 2018-06-02 DIAGNOSIS — J301 Allergic rhinitis due to pollen: Secondary | ICD-10-CM | POA: Diagnosis not present

## 2018-06-02 DIAGNOSIS — J3089 Other allergic rhinitis: Secondary | ICD-10-CM | POA: Diagnosis not present

## 2018-06-04 DIAGNOSIS — J301 Allergic rhinitis due to pollen: Secondary | ICD-10-CM | POA: Diagnosis not present

## 2018-06-04 DIAGNOSIS — J3089 Other allergic rhinitis: Secondary | ICD-10-CM | POA: Diagnosis not present

## 2018-06-06 ENCOUNTER — Other Ambulatory Visit: Payer: Self-pay | Admitting: Family Medicine

## 2018-06-11 DIAGNOSIS — J301 Allergic rhinitis due to pollen: Secondary | ICD-10-CM | POA: Diagnosis not present

## 2018-06-11 DIAGNOSIS — J3089 Other allergic rhinitis: Secondary | ICD-10-CM | POA: Diagnosis not present

## 2018-06-16 DIAGNOSIS — J301 Allergic rhinitis due to pollen: Secondary | ICD-10-CM | POA: Diagnosis not present

## 2018-06-16 DIAGNOSIS — J3089 Other allergic rhinitis: Secondary | ICD-10-CM | POA: Diagnosis not present

## 2018-06-26 DIAGNOSIS — J3089 Other allergic rhinitis: Secondary | ICD-10-CM | POA: Diagnosis not present

## 2018-06-26 DIAGNOSIS — J301 Allergic rhinitis due to pollen: Secondary | ICD-10-CM | POA: Diagnosis not present

## 2018-07-02 DIAGNOSIS — J301 Allergic rhinitis due to pollen: Secondary | ICD-10-CM | POA: Diagnosis not present

## 2018-07-02 DIAGNOSIS — J3089 Other allergic rhinitis: Secondary | ICD-10-CM | POA: Diagnosis not present

## 2018-07-10 DIAGNOSIS — J3089 Other allergic rhinitis: Secondary | ICD-10-CM | POA: Diagnosis not present

## 2018-07-10 DIAGNOSIS — J301 Allergic rhinitis due to pollen: Secondary | ICD-10-CM | POA: Diagnosis not present

## 2018-07-16 DIAGNOSIS — B009 Herpesviral infection, unspecified: Secondary | ICD-10-CM | POA: Diagnosis not present

## 2018-07-16 DIAGNOSIS — B353 Tinea pedis: Secondary | ICD-10-CM | POA: Diagnosis not present

## 2018-07-16 DIAGNOSIS — J3089 Other allergic rhinitis: Secondary | ICD-10-CM | POA: Diagnosis not present

## 2018-07-16 DIAGNOSIS — B354 Tinea corporis: Secondary | ICD-10-CM | POA: Diagnosis not present

## 2018-07-16 DIAGNOSIS — J301 Allergic rhinitis due to pollen: Secondary | ICD-10-CM | POA: Diagnosis not present

## 2018-07-21 ENCOUNTER — Inpatient Hospital Stay
Admission: RE | Admit: 2018-07-21 | Discharge: 2018-07-21 | Disposition: A | Discharge status: Home / Self Care | Payer: BLUE CROSS/BLUE SHIELD | Source: Ambulatory Visit | Attending: Radiology | Admitting: Radiology

## 2018-07-21 ENCOUNTER — Other Ambulatory Visit: Payer: Self-pay | Admitting: Family Medicine

## 2018-07-21 NOTE — Telephone Encounter (Signed)
Medication filled x1 with no refills.   Requires office visit before any further refills can be given.   Letter sent.  

## 2018-07-23 DIAGNOSIS — J301 Allergic rhinitis due to pollen: Secondary | ICD-10-CM | POA: Diagnosis not present

## 2018-07-23 DIAGNOSIS — J3089 Other allergic rhinitis: Secondary | ICD-10-CM | POA: Diagnosis not present

## 2018-07-30 DIAGNOSIS — Z6838 Body mass index (BMI) 38.0-38.9, adult: Secondary | ICD-10-CM | POA: Diagnosis not present

## 2018-07-30 DIAGNOSIS — Z1212 Encounter for screening for malignant neoplasm of rectum: Secondary | ICD-10-CM | POA: Diagnosis not present

## 2018-07-30 DIAGNOSIS — Z01419 Encounter for gynecological examination (general) (routine) without abnormal findings: Secondary | ICD-10-CM | POA: Diagnosis not present

## 2018-08-12 DIAGNOSIS — J019 Acute sinusitis, unspecified: Secondary | ICD-10-CM | POA: Diagnosis not present

## 2018-08-12 DIAGNOSIS — B9689 Other specified bacterial agents as the cause of diseases classified elsewhere: Secondary | ICD-10-CM | POA: Diagnosis not present

## 2018-08-17 DIAGNOSIS — R109 Unspecified abdominal pain: Secondary | ICD-10-CM | POA: Diagnosis not present

## 2018-08-17 DIAGNOSIS — J45909 Unspecified asthma, uncomplicated: Secondary | ICD-10-CM | POA: Diagnosis not present

## 2018-08-17 DIAGNOSIS — R110 Nausea: Secondary | ICD-10-CM | POA: Diagnosis not present

## 2018-08-20 ENCOUNTER — Other Ambulatory Visit: Payer: BLUE CROSS/BLUE SHIELD

## 2018-08-20 DIAGNOSIS — J3089 Other allergic rhinitis: Secondary | ICD-10-CM | POA: Diagnosis not present

## 2018-08-20 DIAGNOSIS — J301 Allergic rhinitis due to pollen: Secondary | ICD-10-CM | POA: Diagnosis not present

## 2018-08-21 ENCOUNTER — Other Ambulatory Visit: Payer: Self-pay | Admitting: Family Medicine

## 2018-08-27 DIAGNOSIS — J3089 Other allergic rhinitis: Secondary | ICD-10-CM | POA: Diagnosis not present

## 2018-08-27 DIAGNOSIS — J301 Allergic rhinitis due to pollen: Secondary | ICD-10-CM | POA: Diagnosis not present

## 2018-09-03 DIAGNOSIS — J3089 Other allergic rhinitis: Secondary | ICD-10-CM | POA: Diagnosis not present

## 2018-09-03 DIAGNOSIS — J301 Allergic rhinitis due to pollen: Secondary | ICD-10-CM | POA: Diagnosis not present

## 2018-09-08 DIAGNOSIS — J301 Allergic rhinitis due to pollen: Secondary | ICD-10-CM | POA: Diagnosis not present

## 2018-09-08 DIAGNOSIS — J3089 Other allergic rhinitis: Secondary | ICD-10-CM | POA: Diagnosis not present

## 2018-09-15 DIAGNOSIS — J301 Allergic rhinitis due to pollen: Secondary | ICD-10-CM | POA: Diagnosis not present

## 2018-09-15 DIAGNOSIS — J3089 Other allergic rhinitis: Secondary | ICD-10-CM | POA: Diagnosis not present

## 2018-09-17 DIAGNOSIS — J3089 Other allergic rhinitis: Secondary | ICD-10-CM | POA: Diagnosis not present

## 2018-09-17 DIAGNOSIS — J301 Allergic rhinitis due to pollen: Secondary | ICD-10-CM | POA: Diagnosis not present

## 2018-09-24 DIAGNOSIS — J301 Allergic rhinitis due to pollen: Secondary | ICD-10-CM | POA: Diagnosis not present

## 2018-09-24 DIAGNOSIS — J3089 Other allergic rhinitis: Secondary | ICD-10-CM | POA: Diagnosis not present

## 2018-10-15 DIAGNOSIS — J3089 Other allergic rhinitis: Secondary | ICD-10-CM | POA: Diagnosis not present

## 2018-10-15 DIAGNOSIS — J301 Allergic rhinitis due to pollen: Secondary | ICD-10-CM | POA: Diagnosis not present

## 2018-10-22 DIAGNOSIS — J0100 Acute maxillary sinusitis, unspecified: Secondary | ICD-10-CM | POA: Diagnosis not present

## 2018-10-22 DIAGNOSIS — J3089 Other allergic rhinitis: Secondary | ICD-10-CM | POA: Diagnosis not present

## 2018-10-22 DIAGNOSIS — J4540 Moderate persistent asthma, uncomplicated: Secondary | ICD-10-CM | POA: Diagnosis not present

## 2018-10-22 DIAGNOSIS — J301 Allergic rhinitis due to pollen: Secondary | ICD-10-CM | POA: Diagnosis not present

## 2018-11-06 ENCOUNTER — Other Ambulatory Visit: Payer: BLUE CROSS/BLUE SHIELD

## 2018-11-06 DIAGNOSIS — Z13220 Encounter for screening for lipoid disorders: Secondary | ICD-10-CM | POA: Diagnosis not present

## 2018-11-06 DIAGNOSIS — E559 Vitamin D deficiency, unspecified: Secondary | ICD-10-CM

## 2018-11-06 DIAGNOSIS — R609 Edema, unspecified: Secondary | ICD-10-CM | POA: Diagnosis not present

## 2018-11-06 DIAGNOSIS — E669 Obesity, unspecified: Secondary | ICD-10-CM

## 2018-11-07 LAB — COMPLETE METABOLIC PANEL WITH GFR
AG RATIO: 1.3 (calc) (ref 1.0–2.5)
ALBUMIN MSPROF: 4.3 g/dL (ref 3.6–5.1)
ALT: 16 U/L (ref 6–29)
AST: 18 U/L (ref 10–35)
Alkaline phosphatase (APISO): 101 U/L (ref 33–130)
BILIRUBIN TOTAL: 0.6 mg/dL (ref 0.2–1.2)
BUN: 17 mg/dL (ref 7–25)
CHLORIDE: 104 mmol/L (ref 98–110)
CO2: 27 mmol/L (ref 20–32)
Calcium: 9.9 mg/dL (ref 8.6–10.4)
Creat: 0.92 mg/dL (ref 0.50–1.05)
GFR, Est African American: 82 mL/min/{1.73_m2} (ref >=60)
GFR, Est Non African American: 71 mL/min/{1.73_m2} (ref >=60)
GLOBULIN: 3.2 g/dL (ref 1.9–3.7)
GLUCOSE: 84 mg/dL (ref 65–99)
POTASSIUM: 4.6 mmol/L (ref 3.5–5.3)
SODIUM: 140 mmol/L (ref 135–146)
Total Protein: 7.5 g/dL (ref 6.1–8.1)

## 2018-11-07 LAB — CBC WITH DIFFERENTIAL/PLATELET
ABSOLUTE MONOCYTES: 531 {cells}/uL (ref 200–950)
BASOS ABS: 83 {cells}/uL (ref 0–200)
BASOS PCT: 1.3 %
EOS ABS: 102 {cells}/uL (ref 15–500)
Eosinophils Relative: 1.6 %
HCT: 39.7 % (ref 35.0–45.0)
HEMOGLOBIN: 12.9 g/dL (ref 11.7–15.5)
Lymphs Abs: 2739 cells/uL (ref 850–3900)
MCH: 28.4 pg (ref 27.0–33.0)
MCHC: 32.5 g/dL (ref 32.0–36.0)
MCV: 87.4 fL (ref 80.0–100.0)
MPV: 11.3 fL (ref 7.5–12.5)
Monocytes Relative: 8.3 %
Neutro Abs: 2944 cells/uL (ref 1500–7800)
Neutrophils Relative %: 46 %
Platelets: 289 10*3/uL (ref 140–400)
RBC: 4.54 10*6/uL (ref 3.80–5.10)
RDW: 11.9 % (ref 11.0–15.0)
Total Lymphocyte: 42.8 %
WBC: 6.4 10*3/uL (ref 3.8–10.8)

## 2018-11-07 LAB — LIPID PANEL
Cholesterol: 198 mg/dL (ref <200)
HDL: 57 mg/dL (ref >50)
LDL CHOLESTEROL (CALC): 119 mg/dL — AB
Non-HDL Cholesterol (Calc): 141 mg/dL (calc) — ABNORMAL HIGH (ref <130)
Total CHOL/HDL Ratio: 3.5 (calc) (ref <5.0)
Triglycerides: 112 mg/dL (ref <150)

## 2018-11-07 LAB — VITAMIN D 25 HYDROXY (VIT D DEFICIENCY, FRACTURES): VIT D 25 HYDROXY: 17 ng/mL — AB (ref 30–100)

## 2018-11-09 ENCOUNTER — Encounter: Payer: Self-pay | Admitting: Family Medicine

## 2018-11-09 ENCOUNTER — Ambulatory Visit (INDEPENDENT_AMBULATORY_CARE_PROVIDER_SITE_OTHER): Payer: BLUE CROSS/BLUE SHIELD | Admitting: Family Medicine

## 2018-11-09 VITALS — BP 110/70 | HR 81 | Temp 98.5°F | Resp 16 | Ht 66.0 in | Wt 236.1 lb

## 2018-11-09 DIAGNOSIS — K219 Gastro-esophageal reflux disease without esophagitis: Secondary | ICD-10-CM

## 2018-11-09 DIAGNOSIS — R609 Edema, unspecified: Secondary | ICD-10-CM

## 2018-11-09 DIAGNOSIS — M79672 Pain in left foot: Secondary | ICD-10-CM

## 2018-11-09 DIAGNOSIS — Z23 Encounter for immunization: Secondary | ICD-10-CM

## 2018-11-09 DIAGNOSIS — E669 Obesity, unspecified: Secondary | ICD-10-CM

## 2018-11-09 DIAGNOSIS — J4540 Moderate persistent asthma, uncomplicated: Secondary | ICD-10-CM

## 2018-11-09 DIAGNOSIS — M722 Plantar fascial fibromatosis: Secondary | ICD-10-CM

## 2018-11-09 DIAGNOSIS — E559 Vitamin D deficiency, unspecified: Secondary | ICD-10-CM

## 2018-11-09 MED ORDER — MOMETASONE FUROATE 0.1 % EX CREA: 1.0000 "application " | TOPICAL_CREAM | Freq: Two times a day (BID) | CUTANEOUS | 3 refills | Status: DC

## 2018-11-09 MED ORDER — VITAMIN D (ERGOCALCIFEROL) 1.25 MG (50000 UNIT) PO CAPS
50000.0000 [IU] | ORAL_CAPSULE | ORAL | 5 refills | Status: DC
Start: 2018-11-09 — End: 2020-01-26

## 2018-11-09 MED ORDER — FUROSEMIDE 20 MG PO TABS: 20.0000 mg | ORAL_TABLET | Freq: Every day | ORAL | 2 refills | Status: DC | PRN

## 2018-11-09 MED ORDER — OMEPRAZOLE-SODIUM BICARBONATE 40-1100 MG PO CAPS: 1.0000 | ORAL_CAPSULE | Freq: Every day | ORAL | 3 refills | Status: AC

## 2018-11-09 NOTE — Assessment & Plan Note (Signed)
Continue to work on dietary changes and weight loss

## 2018-11-09 NOTE — Patient Instructions (Signed)
Referral to podiatry  FLu shot given Vitamin D  50,0000 once a week for 6 months, then start  2000IU once a day  Medications refilled  F/U 6 months

## 2018-11-09 NOTE — Assessment & Plan Note (Signed)
Prn lasix  °

## 2018-11-09 NOTE — Assessment & Plan Note (Signed)
Continue zegerid, symptoms controlled

## 2018-11-09 NOTE — Assessment & Plan Note (Signed)
50,0000 IU weekly x 6 months

## 2018-11-09 NOTE — Progress Notes (Signed)
   Subjective:    Patient ID: Angie Powell, female    DOB: 1966/06/25, 52 y.o.   MRN: [id number]  Patient presents for Gastroesophageal Reflux (Patient in today with c/o ankle pain)   Seen by GYN for Annual Exam , had Mammogram done  Her last visit was in August of 2018   Seen by her allergist given cefdinir- also on singulair  Bilat foot pain worse on the bottom, for months feels like plantar fascitis which she has had in the past, also has pain and burning sensation fom her left arch. She changed into new danskos recently.  ,chronic ankle swelling whichtakes lasix as needed    - took a few doses of aleve for the burning sensation    - used topical anti-inflammatory   GERD- taking zegerid   Obesity- last visit 243lbs,m now down to 57   Was following with Dr. Constance Holster for thyrid nodule   OSA- CPAP  , DR. Athar Nuerology   Review Of Systems:  GEN- denies fatigue, fever, weight loss,weakness, recent illness HEENT- denies eye drainage, change in vision, nasal discharge, CVS- denies chest pain, palpitations RESP- denies SOB, cough, wheeze ABD- denies N/V, change in stools, abd pain GU- denies dysuria, hematuria, dribbling, incontinence MSK- + joint pain, muscle aches, injury Neuro- denies headache, dizziness, syncope, seizure activity       Objective:    BP 110/70   Pulse 81   Temp 98.5 F (36.9 C) (Oral)   Resp 16   Ht 5\' 6"  (1.676 m)   Wt 236 lb 2 oz (107.1 kg)   SpO2 96%   BMI 38.11 kg/m  GEN- NAD, alert and oriented x3 HEENT- PERRL, EOMI, non injected sclera, pink conjunctiva, MMM, oropharynx clear Neck- Supple, no thyromegaly CVS- RRR, no murmur RESP-CTAB ABD-NABS,soft,NT,ND EXT- TTP bilat heels, platar insertion and lateral medial arches. Trace ankle edema, good ROM ankle/foot Pulses- Radial, DP- 2+        Assessment & Plan:      Problem List Items Addressed This Visit      Unprioritized   Asthma    Continues with allergy and asthma meds       GERD    Continue zegerid, symptoms controlled      Relevant Medications   omeprazole-sodium bicarbonate (ZEGERID) 40-1100 MG capsule   Obesity    Continue to work on dietary changes and weight loss      Peripheral edema    Prn lasix      Vitamin D deficiency - Primary (Chronic)    50,0000 IU weekly x 6 months        Other Visit Diagnoses    Plantar fasciitis       Referral to podiatry, may need inserts   Arch pain of left foot       Needs flu shot       Relevant Orders   Flu Vaccine QUAD 36+ mos IM (Completed)      Note: This dictation was prepared with Dragon dictation along with smaller phrase technology. Any transcriptional errors that result from this process are unintentional.

## 2018-11-09 NOTE — Assessment & Plan Note (Signed)
Continues with allergy and asthma meds

## 2018-11-23 ENCOUNTER — Encounter: Payer: Self-pay | Admitting: Podiatry

## 2018-11-23 ENCOUNTER — Ambulatory Visit: Payer: BLUE CROSS/BLUE SHIELD | Admitting: Podiatry

## 2018-11-23 ENCOUNTER — Ambulatory Visit (INDEPENDENT_AMBULATORY_CARE_PROVIDER_SITE_OTHER): Payer: BLUE CROSS/BLUE SHIELD

## 2018-11-23 ENCOUNTER — Other Ambulatory Visit: Payer: Self-pay | Admitting: Podiatry

## 2018-11-23 VITALS — BP 135/78 | HR 85

## 2018-11-23 DIAGNOSIS — M79671 Pain in right foot: Secondary | ICD-10-CM | POA: Diagnosis not present

## 2018-11-23 DIAGNOSIS — M76821 Posterior tibial tendinitis, right leg: Secondary | ICD-10-CM

## 2018-11-23 DIAGNOSIS — M7671 Peroneal tendinitis, right leg: Secondary | ICD-10-CM

## 2018-11-23 DIAGNOSIS — M76822 Posterior tibial tendinitis, left leg: Secondary | ICD-10-CM

## 2018-11-23 DIAGNOSIS — M79672 Pain in left foot: Secondary | ICD-10-CM | POA: Diagnosis not present

## 2018-11-23 DIAGNOSIS — T7840XA Allergy, unspecified, initial encounter: Secondary | ICD-10-CM | POA: Insufficient documentation

## 2018-11-23 MED ORDER — DICLOFENAC SODIUM 75 MG PO TBEC: 75.0000 mg | DELAYED_RELEASE_TABLET | Freq: Two times a day (BID) | ORAL | 2 refills | Status: DC

## 2018-11-23 MED ORDER — TRIAMCINOLONE ACETONIDE 10 MG/ML IJ SUSP
10.0000 mg | Freq: Once | INTRAMUSCULAR | Status: AC
  Administered 2018-11-23: 10 mg

## 2018-11-23 NOTE — Progress Notes (Signed)
DG  °

## 2018-11-30 ENCOUNTER — Ambulatory Visit: Payer: BLUE CROSS/BLUE SHIELD | Admitting: Podiatry

## 2018-11-30 NOTE — Progress Notes (Signed)
Subjective:   Patient ID: Angie Powell, female   DOB: 53 y.o.   MRN: [id number]   HPI Patient presents stating she is having a lot of pain in her ankle left over right and states it is been going on for a while been worse recently and she was wearing flat shoes.  Patient does not smoke likes to be active and is moderately obese   Review of Systems  All other systems reviewed and are negative.       Objective:  Physical Exam Vitals signs and nursing note reviewed.  Constitutional:      Appearance: She is well-developed.  Pulmonary:     Effort: Pulmonary effort is normal.  Musculoskeletal: Normal range of motion.  Skin:    General: Skin is warm.  Neurological:     Mental Status: She is alert.     Neurovascular status found to be intact muscle strength is adequate range of motion within normal limits with patient found to have moderate flatfoot deformity bilateral.  Patient has quite a bit of discomfort in the posterior tibial tendon as it comes around the malleolus left with fluid buildup and does have structural changes of the arch height.  The right is mildly tender but not to the same degree as the left and patient was noted to have good digital perfusion well oriented x3     Assessment:  Acute posterior tibial tendinitis left over right with inflammation fluid as it courses underneath the malleolus     Plan:  H&P condition reviewed and at this point will get a focus on the left.  I did sterile prep and then did sheath injection of the posterior tibial tendon as it comes under the malleolus 3 mg Dexasone Kenalog 5 mg Xylocaine after first explaining risk.  Applied fascial brace to lift the arch advised on supportive shoes and reappoint again 2 or 3 weeks and was encouraged to call with any questions concerns.  Also placed on diclofenac 75 mg twice daily at this time  X-ray indicates moderate depression of the arch bilateral with no indications of arthritis or stress  fracture

## 2018-12-07 ENCOUNTER — Telehealth: Payer: Self-pay | Admitting: *Deleted

## 2018-12-07 ENCOUNTER — Ambulatory Visit: Payer: BLUE CROSS/BLUE SHIELD | Admitting: Podiatry

## 2018-12-07 ENCOUNTER — Encounter: Payer: Self-pay | Admitting: Podiatry

## 2018-12-07 DIAGNOSIS — M79672 Pain in left foot: Secondary | ICD-10-CM | POA: Diagnosis not present

## 2018-12-07 DIAGNOSIS — M76822 Posterior tibial tendinitis, left leg: Secondary | ICD-10-CM

## 2018-12-07 NOTE — Telephone Encounter (Signed)
Pt states Dr. Paulla Dolly had suggested at her appt she needed a handicap sticker.

## 2018-12-09 NOTE — Progress Notes (Signed)
Subjective:   Patient ID: Angie Powell, female   DOB: 53 y.o.   MRN: [id number]   HPI Patient states she has had some improvement but continues to have a lot of pain on the inside of the left ankle that makes it hard for her to walk comfortably.  States that it is sore after she has been on her foot for a period of time and that it gradually becomes more aggravating over time   ROS      Objective:  Physical Exam  Neurovascular status intact with patient found to have moderate flatfoot deformity left with inflammation posterior tibial tendon with no indications currently of tendon dysfunction or tear     Assessment:  Probability for inflammatory process of the posterior tibial tendon secondary to foot structure creating strain on the tendon     Plan:  With symptoms still present and brace not giving her full support I did go ahead and dispensed air fracture walker with all instructions on usage along with aggressive ice and in the next 3 weeks she can reduce this and will be seen back again in 4 weeks.  Reappoint to reevaluate

## 2018-12-10 DIAGNOSIS — J3089 Other allergic rhinitis: Secondary | ICD-10-CM | POA: Diagnosis not present

## 2018-12-10 DIAGNOSIS — J301 Allergic rhinitis due to pollen: Secondary | ICD-10-CM | POA: Diagnosis not present

## 2018-12-11 DIAGNOSIS — J0100 Acute maxillary sinusitis, unspecified: Secondary | ICD-10-CM | POA: Diagnosis not present

## 2018-12-17 DIAGNOSIS — J3089 Other allergic rhinitis: Secondary | ICD-10-CM | POA: Diagnosis not present

## 2018-12-17 DIAGNOSIS — J301 Allergic rhinitis due to pollen: Secondary | ICD-10-CM | POA: Diagnosis not present

## 2019-01-04 ENCOUNTER — Ambulatory Visit: Payer: BLUE CROSS/BLUE SHIELD | Admitting: Podiatry

## 2019-01-04 ENCOUNTER — Encounter: Payer: Self-pay | Admitting: Podiatry

## 2019-01-04 DIAGNOSIS — M76821 Posterior tibial tendinitis, right leg: Secondary | ICD-10-CM

## 2019-01-04 DIAGNOSIS — M76822 Posterior tibial tendinitis, left leg: Secondary | ICD-10-CM | POA: Diagnosis not present

## 2019-01-05 NOTE — Progress Notes (Signed)
Subjective:   Patient ID: Angie Powell, female   DOB: 53 y.o.   MRN: [id number]   HPI Patient states that it seems to be improved with boot usage but she still having pain on the inside of the left ankle and she understands the flatfoot deformity being a big part of her problem   ROS      Objective:  Physical Exam  Neurovascular status intact with patient found to have significant bilateral flatfoot deformity with good range of motion and no crepitus noted currently with excessive eversion     Assessment:  Flatfoot deformity noted bilateral with posterior tibial tendinitis left     Plan:  H&P discussed the importance of gradual reduction of the boot supportive shoes and I did cast for functional orthotics today to try to reduce all plantar stresses on the posterior tip and did discuss if symptoms were to persist through this will get a need to consider MRI with the possibility of tendon repair or flatfoot reconstruction

## 2019-01-22 DIAGNOSIS — J0101 Acute recurrent maxillary sinusitis: Secondary | ICD-10-CM | POA: Diagnosis not present

## 2019-01-29 ENCOUNTER — Telehealth: Payer: Self-pay | Admitting: Podiatry

## 2019-01-29 NOTE — Telephone Encounter (Signed)
Pt left message asking for a call back regarding her orthotics.  I returned the call and left a message for her to please call me back.

## 2019-02-12 DIAGNOSIS — J301 Allergic rhinitis due to pollen: Secondary | ICD-10-CM | POA: Diagnosis not present

## 2019-02-12 DIAGNOSIS — J3089 Other allergic rhinitis: Secondary | ICD-10-CM | POA: Diagnosis not present

## 2019-02-17 ENCOUNTER — Ambulatory Visit: Payer: BLUE CROSS/BLUE SHIELD | Admitting: Podiatry

## 2019-02-17 ENCOUNTER — Encounter: Payer: Self-pay | Admitting: Podiatry

## 2019-02-17 ENCOUNTER — Telehealth: Payer: Self-pay | Admitting: Podiatry

## 2019-02-17 ENCOUNTER — Other Ambulatory Visit: Payer: Self-pay

## 2019-02-17 VITALS — Temp 97.7°F

## 2019-02-17 DIAGNOSIS — M779 Enthesopathy, unspecified: Secondary | ICD-10-CM

## 2019-02-17 DIAGNOSIS — M7752 Other enthesopathy of left foot: Secondary | ICD-10-CM | POA: Diagnosis not present

## 2019-02-17 DIAGNOSIS — L600 Ingrowing nail: Secondary | ICD-10-CM | POA: Diagnosis not present

## 2019-02-17 MED ORDER — TRIAMCINOLONE ACETONIDE 10 MG/ML IJ SUSP
10.0000 mg | Freq: Once | INTRAMUSCULAR | Status: AC
  Administered 2019-02-17: 10 mg

## 2019-02-17 MED ORDER — NEOMYCIN-POLYMYXIN-HC 3.5-10000-1 OT SOLN: OTIC | 0 refills | Status: DC

## 2019-02-17 NOTE — Telephone Encounter (Signed)
Patient is needed a new handicap card, the one she has expires 03/01/2019. Please call patient

## 2019-02-17 NOTE — Patient Instructions (Addendum)
WEARING INSTRUCTIONS FOR ORTHOTICS  Don't expect to be comfortable wearing your orthotic devices for the first time.  Like eyeglasses, you may be aware of them as time passes, they will not be uncomfortable and you will enjoy wearing them.  FOLLOW THESE INSTRUCTIONS EXACTLY!  1. Wear your orthotic devices for:       Not more than 1 hour the first day.       Not more than 2 hours the second day.       Not more than 3 hours the third day and so on.        Or wear them for as long as they feel comfortable.       If you experience discomfort in your feet or legs take them out.  When feet & legs feel       better, put them back in.  You do need to be consistent and wear them a little        everyday. 2.   If at any time the orthotic devices become acutely uncomfortable before the       time for that particular day, STOP WEARING THEM. 3.   On the next day, do not increase the wearing time. 4.   Subsequently, increase the wearing time by 15-30 minutes only if comfortable to do       so. 5.   You will be seen by your doctor about 2-4 weeks after you receive your orthotic       devices, at which time you will probably be wearing your devices comfortably        for about 8 hours or more a day. 6.   Some patients occasionally report mild aches or discomfort in other parts of the of       body such as the knees, hips or back after 3 or 4 consecutive hours of wear.  If this       is the case with you, do not extend your wearing time.  Instead, cut it back an hour or       two.  In all likelihood, these symptoms will disappear in a short period of time as your       body posture realigns itself and functions more efficiently. 7.   It is possible that your orthotic device may require some small changes or adjustment       to improve their function or make them more comfortable.   This is usually not done       before one to three months have elapsed.  These adjustments are made in        accordance  with the changed position your feet are assuming as a result of       improved biomechanical function. 8.   In women's shoes, it's not unusual for your heel to slip out of the shoe, particularly if       they are step-in-shoes.  If this is the case, try other shoes or other styles.  Try to       purchase shoes which have deeper heal seats or higher heel counters. 9.   Squeaking of orthotics devices in the shoes is due to the movement of the devices       when they are functioning normally.  To eliminate squeaking, simply dust some       baby powder into your shoes before inserting the devices.  If this does not work,          apply soap or wax to the edges of the orthotic devices or put a tissue into the shoes. 10. It is important that you follow these directions explicitly.  Failure to do so will simply       prolong the adjustment period or create problems which are easily avoided.  It makes       no difference if you are wearing your orthotic devices for only a few hours after        several months, so long as you are wearing them comfortably for those hours. 11. If you have any questions or complaints, contact our office.  We have no way of       knowing about your problems unless you tell us.  If we do not hear from you, we will       assume that you are proceeding well.     Soak Instructions    THE DAY AFTER THE PROCEDURE  Place 1/4 cup of epsom salts in a quart of warm tap water.  Submerge your foot or feet with outer bandage intact for the initial soak; this will allow the bandage to become moist and wet for easy lift off.  Once you remove your bandage, continue to soak in the solution for 20 minutes.  This soak should be done twice a day.  Next, remove your foot or feet from solution, blot dry the affected area and cover.  You may use a band aid large enough to cover the area or use gauze and tape.  Apply other medications to the area as directed by the doctor such as polysporin  neosporin.  IF YOUR SKIN BECOMES IRRITATED WHILE USING THESE INSTRUCTIONS, IT IS OKAY TO SWITCH TO  WHITE VINEGAR AND WATER. Or you may use antibacterial soap and water to keep the toe clean  Monitor for any signs/symptoms of infection. Call the office immediately if any occur or go directly to the emergency room. Call with any questions/concerns.    [facility] Instructions-Post Nail Surgery  You have had your ingrown toenail and root treated with a chemical.  This chemical causes a burn that will drain and ooze like a blister.  This can drain for 6-8 weeks or longer.  It is important to keep this area clean, covered, and follow the soaking instructions dispensed at the time of your surgery.  This area will eventually dry and form a scab.  Once the scab forms you no longer need to soak or apply a dressing.  If at any time you experience an increase in pain, redness, swelling, or drainage, you should contact the office as soon as possible.

## 2019-02-17 NOTE — Progress Notes (Signed)
Subjective:   Patient ID: Angie Powell, female   DOB: 53 y.o.   MRN: [id number]   HPI Patient presents stating that my left ankle the inside seems okay but it still somewhat sore and I am getting pain in the outside of the ankle joint and I also have an ingrown toenail my right big toe which is been bothering me   ROS      Objective:  Physical Exam  Neurovascular status intact muscle strength is adequate with patient's left medial ankle doing pretty good with depression of the arch noted and the sinus tarsi being quite sore when pressed with inflammation fluid buildup.  On the right I did note incurvated medial border of the hallux that sore when pressed     Assessment:  Capsulitis sinus tarsi left with patient noted to have ingrown toenail right hallux medial border     Plan:  H&P conditions reviewed discussed and at this point I get a focus on both areas.  I discussed ingrown toenail allowed her to read consent form for correction and she wants procedure signed consent form and today I infiltrated the right hallux 60 mg like Marcaine mixture sterile prep applied to the right big toe and using sterile instrumentation I remove the medial border exposed matrix and applied phenol 3 applications 30 seconds followed by alcohol lavage sterile dressing and gave instructions on soaks leaving dressing on 24 hours but to take it off earlier if any throbbing should occur.  I then recommended drops and instructed her to call.  For the left I did sterile prep injected sinus tarsi left 3 mg Kenalog 5 mg Xylocaine begin orthotic usage and reappoint 4 weeks to recheck

## 2019-02-18 DIAGNOSIS — J301 Allergic rhinitis due to pollen: Secondary | ICD-10-CM | POA: Diagnosis not present

## 2019-02-18 DIAGNOSIS — J3089 Other allergic rhinitis: Secondary | ICD-10-CM | POA: Diagnosis not present

## 2019-02-23 ENCOUNTER — Encounter: Payer: BLUE CROSS/BLUE SHIELD | Admitting: Orthotics

## 2019-02-23 DIAGNOSIS — J3089 Other allergic rhinitis: Secondary | ICD-10-CM | POA: Diagnosis not present

## 2019-02-23 DIAGNOSIS — J301 Allergic rhinitis due to pollen: Secondary | ICD-10-CM | POA: Diagnosis not present

## 2019-03-04 DIAGNOSIS — J3089 Other allergic rhinitis: Secondary | ICD-10-CM | POA: Diagnosis not present

## 2019-03-04 DIAGNOSIS — J301 Allergic rhinitis due to pollen: Secondary | ICD-10-CM | POA: Diagnosis not present

## 2019-03-11 DIAGNOSIS — J301 Allergic rhinitis due to pollen: Secondary | ICD-10-CM | POA: Diagnosis not present

## 2019-03-11 DIAGNOSIS — J3089 Other allergic rhinitis: Secondary | ICD-10-CM | POA: Diagnosis not present

## 2019-03-15 ENCOUNTER — Ambulatory Visit: Payer: BLUE CROSS/BLUE SHIELD | Admitting: Podiatry

## 2019-03-18 ENCOUNTER — Ambulatory Visit: Payer: BLUE CROSS/BLUE SHIELD | Admitting: Podiatry

## 2019-03-18 ENCOUNTER — Encounter: Payer: Self-pay | Admitting: Podiatry

## 2019-03-18 ENCOUNTER — Other Ambulatory Visit: Payer: Self-pay

## 2019-03-18 VITALS — Temp 98.1°F

## 2019-03-18 DIAGNOSIS — M76822 Posterior tibial tendinitis, left leg: Secondary | ICD-10-CM

## 2019-03-18 DIAGNOSIS — M79672 Pain in left foot: Secondary | ICD-10-CM

## 2019-03-18 DIAGNOSIS — J301 Allergic rhinitis due to pollen: Secondary | ICD-10-CM | POA: Diagnosis not present

## 2019-03-18 DIAGNOSIS — J3089 Other allergic rhinitis: Secondary | ICD-10-CM | POA: Diagnosis not present

## 2019-03-18 NOTE — Progress Notes (Signed)
Subjective:   Patient ID: Angie Powell, female   DOB: 53 y.o.   MRN: [id number]   HPI Patient presents stating she still is having pain with moderate improvement but it still quite sore and states the orthotics do seem to be helping her a little bit but she continues to have trouble with her ankle left   ROS      Objective:  Physical Exam  Neurovascular status intact with patient has boot and orthotics and continues to exhibit posterior tibial discomfort very mild improvement but still quite a bit of pain with palpation     Assessment:  Continuation of posterior tibial tendinitis left is quite concerning that remains so tender still at this point despite immobilization orthotics physical therapy      Plan:  H&P x-rays reviewed and at this point I do recommend continued boot usage continued orthotics ice and oral anti-inflammatories.  If symptoms do not get better we will need to get an MRI of this and I explained this to her but she wants to try to hold off for more weeks before considering this but if it gets worse she will let us know immediately

## 2019-03-23 DIAGNOSIS — J301 Allergic rhinitis due to pollen: Secondary | ICD-10-CM | POA: Diagnosis not present

## 2019-03-23 DIAGNOSIS — J3089 Other allergic rhinitis: Secondary | ICD-10-CM | POA: Diagnosis not present

## 2019-03-25 DIAGNOSIS — J301 Allergic rhinitis due to pollen: Secondary | ICD-10-CM | POA: Diagnosis not present

## 2019-03-25 DIAGNOSIS — J3089 Other allergic rhinitis: Secondary | ICD-10-CM | POA: Diagnosis not present

## 2019-04-01 DIAGNOSIS — J3089 Other allergic rhinitis: Secondary | ICD-10-CM | POA: Diagnosis not present

## 2019-04-01 DIAGNOSIS — J301 Allergic rhinitis due to pollen: Secondary | ICD-10-CM | POA: Diagnosis not present

## 2019-04-06 ENCOUNTER — Other Ambulatory Visit: Payer: Self-pay

## 2019-04-06 ENCOUNTER — Encounter: Payer: Self-pay | Admitting: Family Medicine

## 2019-04-06 ENCOUNTER — Ambulatory Visit (INDEPENDENT_AMBULATORY_CARE_PROVIDER_SITE_OTHER): Payer: BLUE CROSS/BLUE SHIELD | Admitting: Family Medicine

## 2019-04-06 VITALS — BP 100/62 | HR 91 | Temp 98.9°F | Resp 16 | Ht 66.5 in | Wt 249.0 lb

## 2019-04-06 DIAGNOSIS — M25562 Pain in left knee: Secondary | ICD-10-CM

## 2019-04-06 DIAGNOSIS — M76822 Posterior tibial tendinitis, left leg: Secondary | ICD-10-CM

## 2019-04-06 DIAGNOSIS — M25462 Effusion, left knee: Secondary | ICD-10-CM

## 2019-04-06 DIAGNOSIS — E669 Obesity, unspecified: Secondary | ICD-10-CM | POA: Diagnosis not present

## 2019-04-06 DIAGNOSIS — Z6839 Body mass index (BMI) 39.0-39.9, adult: Secondary | ICD-10-CM

## 2019-04-06 MED ORDER — HYDROCODONE-ACETAMINOPHEN 5-325 MG PO TABS: 1.0000 | ORAL_TABLET | Freq: Four times a day (QID) | ORAL | 0 refills | Status: DC | PRN

## 2019-04-06 MED ORDER — IBUPROFEN 600 MG PO TABS: 600.0000 mg | ORAL_TABLET | Freq: Three times a day (TID) | ORAL | 0 refills | Status: AC | PRN

## 2019-04-06 NOTE — Assessment & Plan Note (Signed)
Discussed dietary changes, cut back on carbs , sugar and fast food Discussed weight loss will help knee and ankle

## 2019-04-06 NOTE — Patient Instructions (Addendum)
Take ibuprofen 600mg  three times a day  Referral to orthopedics  F/U 6 months for Physical

## 2019-04-06 NOTE — Progress Notes (Signed)
   Subjective:    Patient ID: Angie Powell, female    DOB: 1965-12-02, 53 y.o.   MRN: [id number]  Patient presents for Joint Swelling (L knee, x2 weeks, taken ibuprofen)  Pt here with Left knee pain Followed by podiatry for left post tibialis tendinitis which is chronic  A week ago, felt a pain in her left knee, while a twork, no specific injury,noticed popping with bending, cant put pressure directly on the knee.  Tried diclofenac given by podiatry this did not help, but ibuprofen has not helped  Restarted lasix recently feltlike it wasn't working  But took today  She admits to eating  Closer to [place]:  GEN- denies fatigue, fever, weight loss,weakness, recent illness HEENT- denies eye drainage, change in vision, nasal discharge, CVS- denies chest pain, palpitations RESP- denies SOB, cough, wheeze ABD- denies N/V, change in stools, abd pain GU- denies dysuria, hematuria, dribbling, incontinence MSK- + joint pain, muscle aches, injury Neuro- denies headache, dizziness, syncope, seizure activity       Objective:    BP 100/62   Pulse 91   Temp 98.9 F (37.2 C)   Resp 16   Ht 5' 6.5" (1.689 m)   Wt 249 lb (112.9 kg)   SpO2 96%   BMI 39.59 kg/m  GEN- NAD, alert and oriented x3 HEENT- PERRL, EOMI, non injected sclera, pink conjunctiva, MMM, oropharynx clear Neck- Supple, no thyromegaly CVS- RRR, no murmur RESP-CTAB ABD-NABS,soft,NT,ND MSK-Fair ROM bilat knee, +crepitus left knee, small effusion , no warmth , no erythema, ligaments grossly in tact, antalgic gait  EXT- No edema Pulses- Radial, DP- 2+        Assessment & Plan:      Problem List Items Addressed This Visit      Unprioritized   Left tibialis posterior tendinitis   Relevant Orders   Ambulatory referral to Orthopedic Surgery   Obesity    Discussed dietary changes, cut back on carbs , sugar and fast food Discussed weight loss will help knee and ankle       Other  Visit Diagnoses    Effusion of left knee    -  Primary   Referral to orthopedics, given script for ibuprofen and Norco. D/C Diclofenac as not helping. 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 Any transcriptional errors that result from this process are unintentional.

## 2019-04-07 ENCOUNTER — Ambulatory Visit: Payer: BLUE CROSS/BLUE SHIELD | Admitting: Family Medicine

## 2019-04-08 DIAGNOSIS — J3089 Other allergic rhinitis: Secondary | ICD-10-CM | POA: Diagnosis not present

## 2019-04-08 DIAGNOSIS — J301 Allergic rhinitis due to pollen: Secondary | ICD-10-CM | POA: Diagnosis not present

## 2019-04-11 DIAGNOSIS — S83262A Peripheral tear of lateral meniscus, current injury, left knee, initial encounter: Secondary | ICD-10-CM | POA: Diagnosis not present

## 2019-04-15 ENCOUNTER — Ambulatory Visit: Payer: BLUE CROSS/BLUE SHIELD | Admitting: Podiatry

## 2019-04-15 ENCOUNTER — Other Ambulatory Visit: Payer: Self-pay

## 2019-04-15 ENCOUNTER — Encounter: Payer: Self-pay | Admitting: Podiatry

## 2019-04-15 VITALS — Temp 97.9°F

## 2019-04-15 DIAGNOSIS — M76822 Posterior tibial tendinitis, left leg: Secondary | ICD-10-CM | POA: Diagnosis not present

## 2019-04-15 DIAGNOSIS — J301 Allergic rhinitis due to pollen: Secondary | ICD-10-CM | POA: Diagnosis not present

## 2019-04-15 DIAGNOSIS — M79672 Pain in left foot: Secondary | ICD-10-CM

## 2019-04-15 DIAGNOSIS — J3089 Other allergic rhinitis: Secondary | ICD-10-CM | POA: Diagnosis not present

## 2019-04-16 NOTE — Progress Notes (Signed)
Subjective:   Patient ID: Angie Powell, female   DOB: 53 y.o.   MRN: [id number]   HPI Patient states my left foot seems to be improved and while he still gets some discomfort it is better than previous and the orthotics are helping make   ROS      Objective:  Physical Exam  Neurovascular status intact with patient found to have significant flatfoot deformity but diminished discomfort swelling around the posterior tibial tendon left     Assessment:  Posterior tibial tendinitis left appears to be improving despite structural changes     Plan:  Reviewed supportive shoes and utilization of boot as needed along with orthotics and patient will be seen back and ultimately may require change in orthotics with more left or possibly MRI if symptoms were to get bad again.  Patient is encouraged to call with any questions concerns she may

## 2019-04-20 DIAGNOSIS — J301 Allergic rhinitis due to pollen: Secondary | ICD-10-CM | POA: Diagnosis not present

## 2019-04-20 DIAGNOSIS — J3089 Other allergic rhinitis: Secondary | ICD-10-CM | POA: Diagnosis not present

## 2019-04-27 ENCOUNTER — Telehealth: Payer: Self-pay | Admitting: *Deleted

## 2019-04-27 DIAGNOSIS — J3089 Other allergic rhinitis: Secondary | ICD-10-CM | POA: Diagnosis not present

## 2019-04-27 DIAGNOSIS — J301 Allergic rhinitis due to pollen: Secondary | ICD-10-CM | POA: Diagnosis not present

## 2019-04-27 NOTE — Telephone Encounter (Signed)
Okay to give handicap placard Temp 6 months She was referred to Orthopedics in [facility], does she have appt scheduled

## 2019-04-27 NOTE — Telephone Encounter (Signed)
Received call from patient.   Reports that she continues to have swelling and pain on L knee and requested handicap placard.   MD please advise.

## 2019-04-27 NOTE — Telephone Encounter (Signed)
Call placed to patient and patient made aware.   Forms placed on desk for signature.   Also states that she was seen at [facility] over weekend for effusion and was given cortisone injection. States that knee was improved for a little while, but has begun swelling again.   Given information for [facility] to contact them for appointment.

## 2019-05-04 DIAGNOSIS — G8929 Other chronic pain: Secondary | ICD-10-CM | POA: Diagnosis not present

## 2019-05-04 DIAGNOSIS — M25562 Pain in left knee: Secondary | ICD-10-CM | POA: Diagnosis not present

## 2019-05-04 DIAGNOSIS — M2392 Unspecified internal derangement of left knee: Secondary | ICD-10-CM | POA: Diagnosis not present

## 2019-05-06 DIAGNOSIS — Z833 Family history of diabetes mellitus: Secondary | ICD-10-CM | POA: Diagnosis not present

## 2019-05-06 DIAGNOSIS — Z6838 Body mass index (BMI) 38.0-38.9, adult: Secondary | ICD-10-CM | POA: Diagnosis not present

## 2019-05-06 DIAGNOSIS — Z713 Dietary counseling and surveillance: Secondary | ICD-10-CM | POA: Diagnosis not present

## 2019-05-06 DIAGNOSIS — E6609 Other obesity due to excess calories: Secondary | ICD-10-CM | POA: Diagnosis not present

## 2019-05-07 DIAGNOSIS — M2392 Unspecified internal derangement of left knee: Secondary | ICD-10-CM | POA: Diagnosis not present

## 2019-05-07 DIAGNOSIS — J301 Allergic rhinitis due to pollen: Secondary | ICD-10-CM | POA: Diagnosis not present

## 2019-05-07 DIAGNOSIS — M25562 Pain in left knee: Secondary | ICD-10-CM | POA: Diagnosis not present

## 2019-05-07 DIAGNOSIS — J3089 Other allergic rhinitis: Secondary | ICD-10-CM | POA: Diagnosis not present

## 2019-05-07 DIAGNOSIS — G8929 Other chronic pain: Secondary | ICD-10-CM | POA: Diagnosis not present

## 2019-05-11 DIAGNOSIS — M23322 Other meniscus derangements, posterior horn of medial meniscus, left knee: Secondary | ICD-10-CM | POA: Insufficient documentation

## 2019-05-11 DIAGNOSIS — J301 Allergic rhinitis due to pollen: Secondary | ICD-10-CM | POA: Diagnosis not present

## 2019-05-11 DIAGNOSIS — J3089 Other allergic rhinitis: Secondary | ICD-10-CM | POA: Diagnosis not present

## 2019-05-13 DIAGNOSIS — E6609 Other obesity due to excess calories: Secondary | ICD-10-CM | POA: Diagnosis not present

## 2019-05-13 DIAGNOSIS — Z6838 Body mass index (BMI) 38.0-38.9, adult: Secondary | ICD-10-CM | POA: Diagnosis not present

## 2019-05-13 DIAGNOSIS — Z833 Family history of diabetes mellitus: Secondary | ICD-10-CM | POA: Diagnosis not present

## 2019-05-13 DIAGNOSIS — Z713 Dietary counseling and surveillance: Secondary | ICD-10-CM | POA: Diagnosis not present

## 2019-05-19 DIAGNOSIS — Z6838 Body mass index (BMI) 38.0-38.9, adult: Secondary | ICD-10-CM | POA: Diagnosis not present

## 2019-05-19 DIAGNOSIS — E6609 Other obesity due to excess calories: Secondary | ICD-10-CM | POA: Diagnosis not present

## 2019-05-19 DIAGNOSIS — Z713 Dietary counseling and surveillance: Secondary | ICD-10-CM | POA: Diagnosis not present

## 2019-05-19 DIAGNOSIS — Z8249 Family history of ischemic heart disease and other diseases of the circulatory system: Secondary | ICD-10-CM | POA: Diagnosis not present

## 2019-05-25 DIAGNOSIS — J301 Allergic rhinitis due to pollen: Secondary | ICD-10-CM | POA: Diagnosis not present

## 2019-05-25 DIAGNOSIS — J3089 Other allergic rhinitis: Secondary | ICD-10-CM | POA: Diagnosis not present

## 2019-05-27 DIAGNOSIS — E6609 Other obesity due to excess calories: Secondary | ICD-10-CM | POA: Diagnosis not present

## 2019-05-27 DIAGNOSIS — Z6837 Body mass index (BMI) 37.0-37.9, adult: Secondary | ICD-10-CM | POA: Diagnosis not present

## 2019-05-27 DIAGNOSIS — Z833 Family history of diabetes mellitus: Secondary | ICD-10-CM | POA: Diagnosis not present

## 2019-05-27 DIAGNOSIS — Z713 Dietary counseling and surveillance: Secondary | ICD-10-CM | POA: Diagnosis not present

## 2019-06-03 DIAGNOSIS — J3089 Other allergic rhinitis: Secondary | ICD-10-CM | POA: Diagnosis not present

## 2019-06-03 DIAGNOSIS — J301 Allergic rhinitis due to pollen: Secondary | ICD-10-CM | POA: Diagnosis not present

## 2019-06-04 DIAGNOSIS — Z6837 Body mass index (BMI) 37.0-37.9, adult: Secondary | ICD-10-CM | POA: Diagnosis not present

## 2019-06-04 DIAGNOSIS — Z713 Dietary counseling and surveillance: Secondary | ICD-10-CM | POA: Diagnosis not present

## 2019-06-04 DIAGNOSIS — E6609 Other obesity due to excess calories: Secondary | ICD-10-CM | POA: Diagnosis not present

## 2019-06-04 DIAGNOSIS — Z833 Family history of diabetes mellitus: Secondary | ICD-10-CM | POA: Diagnosis not present

## 2019-06-10 DIAGNOSIS — Z833 Family history of diabetes mellitus: Secondary | ICD-10-CM | POA: Diagnosis not present

## 2019-06-10 DIAGNOSIS — Z713 Dietary counseling and surveillance: Secondary | ICD-10-CM | POA: Diagnosis not present

## 2019-06-10 DIAGNOSIS — J301 Allergic rhinitis due to pollen: Secondary | ICD-10-CM | POA: Diagnosis not present

## 2019-06-10 DIAGNOSIS — J3089 Other allergic rhinitis: Secondary | ICD-10-CM | POA: Diagnosis not present

## 2019-06-10 DIAGNOSIS — E6609 Other obesity due to excess calories: Secondary | ICD-10-CM | POA: Diagnosis not present

## 2019-06-10 DIAGNOSIS — Z6837 Body mass index (BMI) 37.0-37.9, adult: Secondary | ICD-10-CM | POA: Diagnosis not present

## 2019-06-15 DIAGNOSIS — J3089 Other allergic rhinitis: Secondary | ICD-10-CM | POA: Diagnosis not present

## 2019-06-15 DIAGNOSIS — J301 Allergic rhinitis due to pollen: Secondary | ICD-10-CM | POA: Diagnosis not present

## 2019-06-18 DIAGNOSIS — Z833 Family history of diabetes mellitus: Secondary | ICD-10-CM | POA: Diagnosis not present

## 2019-06-18 DIAGNOSIS — Z713 Dietary counseling and surveillance: Secondary | ICD-10-CM | POA: Diagnosis not present

## 2019-06-18 DIAGNOSIS — Z6837 Body mass index (BMI) 37.0-37.9, adult: Secondary | ICD-10-CM | POA: Diagnosis not present

## 2019-06-18 DIAGNOSIS — E6609 Other obesity due to excess calories: Secondary | ICD-10-CM | POA: Diagnosis not present

## 2019-06-22 DIAGNOSIS — J301 Allergic rhinitis due to pollen: Secondary | ICD-10-CM | POA: Diagnosis not present

## 2019-06-22 DIAGNOSIS — J3089 Other allergic rhinitis: Secondary | ICD-10-CM | POA: Diagnosis not present

## 2019-06-24 DIAGNOSIS — Z6837 Body mass index (BMI) 37.0-37.9, adult: Secondary | ICD-10-CM | POA: Diagnosis not present

## 2019-06-24 DIAGNOSIS — E6609 Other obesity due to excess calories: Secondary | ICD-10-CM | POA: Diagnosis not present

## 2019-06-24 DIAGNOSIS — Z833 Family history of diabetes mellitus: Secondary | ICD-10-CM | POA: Diagnosis not present

## 2019-06-24 DIAGNOSIS — J301 Allergic rhinitis due to pollen: Secondary | ICD-10-CM | POA: Diagnosis not present

## 2019-06-24 DIAGNOSIS — Z713 Dietary counseling and surveillance: Secondary | ICD-10-CM | POA: Diagnosis not present

## 2019-06-24 DIAGNOSIS — J3089 Other allergic rhinitis: Secondary | ICD-10-CM | POA: Diagnosis not present

## 2019-07-01 DIAGNOSIS — Z6837 Body mass index (BMI) 37.0-37.9, adult: Secondary | ICD-10-CM | POA: Diagnosis not present

## 2019-07-01 DIAGNOSIS — E6609 Other obesity due to excess calories: Secondary | ICD-10-CM | POA: Diagnosis not present

## 2019-07-01 DIAGNOSIS — Z713 Dietary counseling and surveillance: Secondary | ICD-10-CM | POA: Diagnosis not present

## 2019-07-01 DIAGNOSIS — Z833 Family history of diabetes mellitus: Secondary | ICD-10-CM | POA: Diagnosis not present

## 2019-07-06 ENCOUNTER — Other Ambulatory Visit: Payer: Self-pay | Admitting: Obstetrics and Gynecology

## 2019-07-06 DIAGNOSIS — J301 Allergic rhinitis due to pollen: Secondary | ICD-10-CM | POA: Diagnosis not present

## 2019-07-06 DIAGNOSIS — N6310 Unspecified lump in the right breast, unspecified quadrant: Secondary | ICD-10-CM

## 2019-07-06 DIAGNOSIS — J3089 Other allergic rhinitis: Secondary | ICD-10-CM | POA: Diagnosis not present

## 2019-07-09 DIAGNOSIS — Z6837 Body mass index (BMI) 37.0-37.9, adult: Secondary | ICD-10-CM | POA: Diagnosis not present

## 2019-07-09 DIAGNOSIS — Z713 Dietary counseling and surveillance: Secondary | ICD-10-CM | POA: Diagnosis not present

## 2019-07-09 DIAGNOSIS — Z833 Family history of diabetes mellitus: Secondary | ICD-10-CM | POA: Diagnosis not present

## 2019-07-09 DIAGNOSIS — E6609 Other obesity due to excess calories: Secondary | ICD-10-CM | POA: Diagnosis not present

## 2019-07-15 DIAGNOSIS — E7841 Elevated Lipoprotein(a): Secondary | ICD-10-CM | POA: Diagnosis not present

## 2019-07-15 DIAGNOSIS — E6609 Other obesity due to excess calories: Secondary | ICD-10-CM | POA: Diagnosis not present

## 2019-07-15 DIAGNOSIS — Z6837 Body mass index (BMI) 37.0-37.9, adult: Secondary | ICD-10-CM | POA: Diagnosis not present

## 2019-07-15 DIAGNOSIS — Z713 Dietary counseling and surveillance: Secondary | ICD-10-CM | POA: Diagnosis not present

## 2019-07-20 ENCOUNTER — Ambulatory Visit
Admission: RE | Admit: 2019-07-20 | Discharge: 2019-07-20 | Disposition: A | Discharge status: Home / Self Care | Payer: BLUE CROSS/BLUE SHIELD | Source: Ambulatory Visit | Attending: Obstetrics and Gynecology | Admitting: Obstetrics and Gynecology

## 2019-07-20 ENCOUNTER — Other Ambulatory Visit: Payer: Self-pay

## 2019-07-20 ENCOUNTER — Ambulatory Visit
Admission: RE | Admit: 2019-07-20 | Discharge: 2019-07-20 | Disposition: A | Discharge status: Home / Self Care | Payer: BC Managed Care – PPO | Source: Ambulatory Visit | Attending: Obstetrics and Gynecology | Admitting: Obstetrics and Gynecology

## 2019-07-20 DIAGNOSIS — J301 Allergic rhinitis due to pollen: Secondary | ICD-10-CM | POA: Diagnosis not present

## 2019-07-20 DIAGNOSIS — N6313 Unspecified lump in the right breast, lower outer quadrant: Secondary | ICD-10-CM | POA: Diagnosis not present

## 2019-07-20 DIAGNOSIS — J3089 Other allergic rhinitis: Secondary | ICD-10-CM | POA: Diagnosis not present

## 2019-07-20 DIAGNOSIS — N6310 Unspecified lump in the right breast, unspecified quadrant: Secondary | ICD-10-CM

## 2019-07-20 DIAGNOSIS — R928 Other abnormal and inconclusive findings on diagnostic imaging of breast: Secondary | ICD-10-CM | POA: Diagnosis not present

## 2019-07-21 DIAGNOSIS — E7841 Elevated Lipoprotein(a): Secondary | ICD-10-CM | POA: Diagnosis not present

## 2019-07-21 DIAGNOSIS — Z6837 Body mass index (BMI) 37.0-37.9, adult: Secondary | ICD-10-CM | POA: Diagnosis not present

## 2019-07-21 DIAGNOSIS — E6609 Other obesity due to excess calories: Secondary | ICD-10-CM | POA: Diagnosis not present

## 2019-07-21 DIAGNOSIS — Z713 Dietary counseling and surveillance: Secondary | ICD-10-CM | POA: Diagnosis not present

## 2019-07-29 DIAGNOSIS — E6609 Other obesity due to excess calories: Secondary | ICD-10-CM | POA: Diagnosis not present

## 2019-07-29 DIAGNOSIS — Z6837 Body mass index (BMI) 37.0-37.9, adult: Secondary | ICD-10-CM | POA: Diagnosis not present

## 2019-07-29 DIAGNOSIS — J3089 Other allergic rhinitis: Secondary | ICD-10-CM | POA: Diagnosis not present

## 2019-07-29 DIAGNOSIS — J301 Allergic rhinitis due to pollen: Secondary | ICD-10-CM | POA: Diagnosis not present

## 2019-07-29 DIAGNOSIS — Z713 Dietary counseling and surveillance: Secondary | ICD-10-CM | POA: Diagnosis not present

## 2019-07-29 DIAGNOSIS — D649 Anemia, unspecified: Secondary | ICD-10-CM | POA: Diagnosis not present

## 2019-07-30 DIAGNOSIS — J3089 Other allergic rhinitis: Secondary | ICD-10-CM | POA: Diagnosis not present

## 2019-08-05 DIAGNOSIS — Z803 Family history of malignant neoplasm of breast: Secondary | ICD-10-CM | POA: Diagnosis not present

## 2019-08-05 DIAGNOSIS — G43909 Migraine, unspecified, not intractable, without status migrainosus: Secondary | ICD-10-CM | POA: Insufficient documentation

## 2019-08-05 DIAGNOSIS — J3089 Other allergic rhinitis: Secondary | ICD-10-CM | POA: Diagnosis not present

## 2019-08-05 DIAGNOSIS — J301 Allergic rhinitis due to pollen: Secondary | ICD-10-CM | POA: Diagnosis not present

## 2019-08-05 DIAGNOSIS — Z808 Family history of malignant neoplasm of other organs or systems: Secondary | ICD-10-CM | POA: Diagnosis not present

## 2019-08-05 DIAGNOSIS — Z01419 Encounter for gynecological examination (general) (routine) without abnormal findings: Secondary | ICD-10-CM | POA: Diagnosis not present

## 2019-08-05 DIAGNOSIS — Z8041 Family history of malignant neoplasm of ovary: Secondary | ICD-10-CM | POA: Diagnosis not present

## 2019-08-05 DIAGNOSIS — Z6837 Body mass index (BMI) 37.0-37.9, adult: Secondary | ICD-10-CM | POA: Diagnosis not present

## 2019-08-12 DIAGNOSIS — Z833 Family history of diabetes mellitus: Secondary | ICD-10-CM | POA: Diagnosis not present

## 2019-08-12 DIAGNOSIS — E6609 Other obesity due to excess calories: Secondary | ICD-10-CM | POA: Diagnosis not present

## 2019-08-12 DIAGNOSIS — Z6837 Body mass index (BMI) 37.0-37.9, adult: Secondary | ICD-10-CM | POA: Diagnosis not present

## 2019-08-12 DIAGNOSIS — Z713 Dietary counseling and surveillance: Secondary | ICD-10-CM | POA: Diagnosis not present

## 2019-08-13 DIAGNOSIS — J3089 Other allergic rhinitis: Secondary | ICD-10-CM | POA: Diagnosis not present

## 2019-08-13 DIAGNOSIS — J301 Allergic rhinitis due to pollen: Secondary | ICD-10-CM | POA: Diagnosis not present

## 2019-08-19 DIAGNOSIS — J3089 Other allergic rhinitis: Secondary | ICD-10-CM | POA: Diagnosis not present

## 2019-08-19 DIAGNOSIS — J301 Allergic rhinitis due to pollen: Secondary | ICD-10-CM | POA: Diagnosis not present

## 2019-08-20 DIAGNOSIS — Z713 Dietary counseling and surveillance: Secondary | ICD-10-CM | POA: Diagnosis not present

## 2019-08-20 DIAGNOSIS — Z8249 Family history of ischemic heart disease and other diseases of the circulatory system: Secondary | ICD-10-CM | POA: Diagnosis not present

## 2019-08-20 DIAGNOSIS — E6609 Other obesity due to excess calories: Secondary | ICD-10-CM | POA: Diagnosis not present

## 2019-08-20 DIAGNOSIS — Z6837 Body mass index (BMI) 37.0-37.9, adult: Secondary | ICD-10-CM | POA: Diagnosis not present

## 2019-08-26 DIAGNOSIS — E6609 Other obesity due to excess calories: Secondary | ICD-10-CM | POA: Diagnosis not present

## 2019-08-26 DIAGNOSIS — Z833 Family history of diabetes mellitus: Secondary | ICD-10-CM | POA: Diagnosis not present

## 2019-08-26 DIAGNOSIS — Z713 Dietary counseling and surveillance: Secondary | ICD-10-CM | POA: Diagnosis not present

## 2019-08-26 DIAGNOSIS — J3089 Other allergic rhinitis: Secondary | ICD-10-CM | POA: Diagnosis not present

## 2019-08-26 DIAGNOSIS — J301 Allergic rhinitis due to pollen: Secondary | ICD-10-CM | POA: Diagnosis not present

## 2019-08-26 DIAGNOSIS — Z6837 Body mass index (BMI) 37.0-37.9, adult: Secondary | ICD-10-CM | POA: Diagnosis not present

## 2019-09-03 DIAGNOSIS — J3089 Other allergic rhinitis: Secondary | ICD-10-CM | POA: Diagnosis not present

## 2019-09-03 DIAGNOSIS — J301 Allergic rhinitis due to pollen: Secondary | ICD-10-CM | POA: Diagnosis not present

## 2019-09-09 DIAGNOSIS — J301 Allergic rhinitis due to pollen: Secondary | ICD-10-CM | POA: Diagnosis not present

## 2019-09-09 DIAGNOSIS — Z713 Dietary counseling and surveillance: Secondary | ICD-10-CM | POA: Diagnosis not present

## 2019-09-09 DIAGNOSIS — Z6836 Body mass index (BMI) 36.0-36.9, adult: Secondary | ICD-10-CM | POA: Diagnosis not present

## 2019-09-09 DIAGNOSIS — E6609 Other obesity due to excess calories: Secondary | ICD-10-CM | POA: Diagnosis not present

## 2019-09-09 DIAGNOSIS — D649 Anemia, unspecified: Secondary | ICD-10-CM | POA: Diagnosis not present

## 2019-09-09 DIAGNOSIS — J3089 Other allergic rhinitis: Secondary | ICD-10-CM | POA: Diagnosis not present

## 2019-09-16 DIAGNOSIS — D649 Anemia, unspecified: Secondary | ICD-10-CM | POA: Diagnosis not present

## 2019-09-16 DIAGNOSIS — E6609 Other obesity due to excess calories: Secondary | ICD-10-CM | POA: Diagnosis not present

## 2019-09-16 DIAGNOSIS — Z6836 Body mass index (BMI) 36.0-36.9, adult: Secondary | ICD-10-CM | POA: Diagnosis not present

## 2019-09-16 DIAGNOSIS — J3089 Other allergic rhinitis: Secondary | ICD-10-CM | POA: Diagnosis not present

## 2019-09-16 DIAGNOSIS — Z713 Dietary counseling and surveillance: Secondary | ICD-10-CM | POA: Diagnosis not present

## 2019-09-16 DIAGNOSIS — J301 Allergic rhinitis due to pollen: Secondary | ICD-10-CM | POA: Diagnosis not present

## 2019-09-23 DIAGNOSIS — J301 Allergic rhinitis due to pollen: Secondary | ICD-10-CM | POA: Diagnosis not present

## 2019-09-23 DIAGNOSIS — E6609 Other obesity due to excess calories: Secondary | ICD-10-CM | POA: Diagnosis not present

## 2019-09-23 DIAGNOSIS — Z713 Dietary counseling and surveillance: Secondary | ICD-10-CM | POA: Diagnosis not present

## 2019-09-23 DIAGNOSIS — Z6836 Body mass index (BMI) 36.0-36.9, adult: Secondary | ICD-10-CM | POA: Diagnosis not present

## 2019-09-23 DIAGNOSIS — E7841 Elevated Lipoprotein(a): Secondary | ICD-10-CM | POA: Diagnosis not present

## 2019-09-23 DIAGNOSIS — J3089 Other allergic rhinitis: Secondary | ICD-10-CM | POA: Diagnosis not present

## 2019-09-29 DIAGNOSIS — E6609 Other obesity due to excess calories: Secondary | ICD-10-CM | POA: Diagnosis not present

## 2019-09-29 DIAGNOSIS — Z6836 Body mass index (BMI) 36.0-36.9, adult: Secondary | ICD-10-CM | POA: Diagnosis not present

## 2019-09-29 DIAGNOSIS — M150 Primary generalized (osteo)arthritis: Secondary | ICD-10-CM | POA: Diagnosis not present

## 2019-09-29 DIAGNOSIS — Z713 Dietary counseling and surveillance: Secondary | ICD-10-CM | POA: Diagnosis not present

## 2019-09-30 DIAGNOSIS — J301 Allergic rhinitis due to pollen: Secondary | ICD-10-CM | POA: Diagnosis not present

## 2019-09-30 DIAGNOSIS — J3089 Other allergic rhinitis: Secondary | ICD-10-CM | POA: Diagnosis not present

## 2019-10-14 DIAGNOSIS — J301 Allergic rhinitis due to pollen: Secondary | ICD-10-CM | POA: Diagnosis not present

## 2019-10-14 DIAGNOSIS — E6609 Other obesity due to excess calories: Secondary | ICD-10-CM | POA: Diagnosis not present

## 2019-10-14 DIAGNOSIS — D649 Anemia, unspecified: Secondary | ICD-10-CM | POA: Diagnosis not present

## 2019-10-14 DIAGNOSIS — J3089 Other allergic rhinitis: Secondary | ICD-10-CM | POA: Diagnosis not present

## 2019-10-14 DIAGNOSIS — Z6836 Body mass index (BMI) 36.0-36.9, adult: Secondary | ICD-10-CM | POA: Diagnosis not present

## 2019-10-14 DIAGNOSIS — Z713 Dietary counseling and surveillance: Secondary | ICD-10-CM | POA: Diagnosis not present

## 2019-10-15 DIAGNOSIS — J029 Acute pharyngitis, unspecified: Secondary | ICD-10-CM | POA: Diagnosis not present

## 2019-10-15 DIAGNOSIS — J069 Acute upper respiratory infection, unspecified: Secondary | ICD-10-CM | POA: Diagnosis not present

## 2019-10-15 DIAGNOSIS — R0981 Nasal congestion: Secondary | ICD-10-CM | POA: Diagnosis not present

## 2019-10-15 DIAGNOSIS — R0602 Shortness of breath: Secondary | ICD-10-CM | POA: Diagnosis not present

## 2019-10-28 DIAGNOSIS — J3089 Other allergic rhinitis: Secondary | ICD-10-CM | POA: Diagnosis not present

## 2019-10-28 DIAGNOSIS — J301 Allergic rhinitis due to pollen: Secondary | ICD-10-CM | POA: Diagnosis not present

## 2019-11-02 DIAGNOSIS — Z833 Family history of diabetes mellitus: Secondary | ICD-10-CM | POA: Diagnosis not present

## 2019-11-02 DIAGNOSIS — J3089 Other allergic rhinitis: Secondary | ICD-10-CM | POA: Diagnosis not present

## 2019-11-02 DIAGNOSIS — E6609 Other obesity due to excess calories: Secondary | ICD-10-CM | POA: Diagnosis not present

## 2019-11-02 DIAGNOSIS — Z6836 Body mass index (BMI) 36.0-36.9, adult: Secondary | ICD-10-CM | POA: Diagnosis not present

## 2019-11-02 DIAGNOSIS — J301 Allergic rhinitis due to pollen: Secondary | ICD-10-CM | POA: Diagnosis not present

## 2019-11-02 DIAGNOSIS — Z713 Dietary counseling and surveillance: Secondary | ICD-10-CM | POA: Diagnosis not present

## 2019-11-11 DIAGNOSIS — E6609 Other obesity due to excess calories: Secondary | ICD-10-CM | POA: Diagnosis not present

## 2019-11-11 DIAGNOSIS — Z713 Dietary counseling and surveillance: Secondary | ICD-10-CM | POA: Diagnosis not present

## 2019-11-11 DIAGNOSIS — Z833 Family history of diabetes mellitus: Secondary | ICD-10-CM | POA: Diagnosis not present

## 2019-11-11 DIAGNOSIS — Z6825 Body mass index (BMI) 25.0-25.9, adult: Secondary | ICD-10-CM | POA: Diagnosis not present

## 2019-11-18 DIAGNOSIS — Z833 Family history of diabetes mellitus: Secondary | ICD-10-CM | POA: Diagnosis not present

## 2019-11-18 DIAGNOSIS — E6609 Other obesity due to excess calories: Secondary | ICD-10-CM | POA: Diagnosis not present

## 2019-11-18 DIAGNOSIS — Z713 Dietary counseling and surveillance: Secondary | ICD-10-CM | POA: Diagnosis not present

## 2019-11-18 DIAGNOSIS — Z6835 Body mass index (BMI) 35.0-35.9, adult: Secondary | ICD-10-CM | POA: Diagnosis not present

## 2019-12-02 DIAGNOSIS — Z6835 Body mass index (BMI) 35.0-35.9, adult: Secondary | ICD-10-CM | POA: Diagnosis not present

## 2019-12-02 DIAGNOSIS — J301 Allergic rhinitis due to pollen: Secondary | ICD-10-CM | POA: Diagnosis not present

## 2019-12-02 DIAGNOSIS — Z713 Dietary counseling and surveillance: Secondary | ICD-10-CM | POA: Diagnosis not present

## 2019-12-02 DIAGNOSIS — J3089 Other allergic rhinitis: Secondary | ICD-10-CM | POA: Diagnosis not present

## 2019-12-02 DIAGNOSIS — M150 Primary generalized (osteo)arthritis: Secondary | ICD-10-CM | POA: Diagnosis not present

## 2019-12-02 DIAGNOSIS — E6609 Other obesity due to excess calories: Secondary | ICD-10-CM | POA: Diagnosis not present

## 2019-12-09 DIAGNOSIS — Z833 Family history of diabetes mellitus: Secondary | ICD-10-CM | POA: Diagnosis not present

## 2019-12-09 DIAGNOSIS — Z713 Dietary counseling and surveillance: Secondary | ICD-10-CM | POA: Diagnosis not present

## 2019-12-09 DIAGNOSIS — Z6835 Body mass index (BMI) 35.0-35.9, adult: Secondary | ICD-10-CM | POA: Diagnosis not present

## 2019-12-09 DIAGNOSIS — E6609 Other obesity due to excess calories: Secondary | ICD-10-CM | POA: Diagnosis not present

## 2019-12-09 DIAGNOSIS — J3089 Other allergic rhinitis: Secondary | ICD-10-CM | POA: Diagnosis not present

## 2019-12-09 DIAGNOSIS — J301 Allergic rhinitis due to pollen: Secondary | ICD-10-CM | POA: Diagnosis not present

## 2019-12-16 DIAGNOSIS — J301 Allergic rhinitis due to pollen: Secondary | ICD-10-CM | POA: Diagnosis not present

## 2019-12-16 DIAGNOSIS — J3089 Other allergic rhinitis: Secondary | ICD-10-CM | POA: Diagnosis not present

## 2019-12-16 DIAGNOSIS — M150 Primary generalized (osteo)arthritis: Secondary | ICD-10-CM | POA: Diagnosis not present

## 2019-12-16 DIAGNOSIS — Z713 Dietary counseling and surveillance: Secondary | ICD-10-CM | POA: Diagnosis not present

## 2019-12-16 DIAGNOSIS — Z6835 Body mass index (BMI) 35.0-35.9, adult: Secondary | ICD-10-CM | POA: Diagnosis not present

## 2019-12-16 DIAGNOSIS — J3081 Allergic rhinitis due to animal (cat) (dog) hair and dander: Secondary | ICD-10-CM | POA: Diagnosis not present

## 2019-12-16 DIAGNOSIS — E6609 Other obesity due to excess calories: Secondary | ICD-10-CM | POA: Diagnosis not present

## 2019-12-23 ENCOUNTER — Other Ambulatory Visit: Payer: Self-pay | Admitting: *Deleted

## 2019-12-23 DIAGNOSIS — Z6835 Body mass index (BMI) 35.0-35.9, adult: Secondary | ICD-10-CM | POA: Diagnosis not present

## 2019-12-23 DIAGNOSIS — J3089 Other allergic rhinitis: Secondary | ICD-10-CM | POA: Diagnosis not present

## 2019-12-23 DIAGNOSIS — Z713 Dietary counseling and surveillance: Secondary | ICD-10-CM | POA: Diagnosis not present

## 2019-12-23 DIAGNOSIS — M150 Primary generalized (osteo)arthritis: Secondary | ICD-10-CM | POA: Diagnosis not present

## 2019-12-23 DIAGNOSIS — J301 Allergic rhinitis due to pollen: Secondary | ICD-10-CM | POA: Diagnosis not present

## 2019-12-23 DIAGNOSIS — E6609 Other obesity due to excess calories: Secondary | ICD-10-CM | POA: Diagnosis not present

## 2019-12-23 MED ORDER — KETOCONAZOLE 2 % EX CREA: TOPICAL_CREAM | Freq: Two times a day (BID) | CUTANEOUS | 1 refills | Status: AC | PRN

## 2019-12-23 MED ORDER — MOMETASONE FUROATE 0.1 % EX CREA: 1.0000 "application " | TOPICAL_CREAM | Freq: Two times a day (BID) | CUTANEOUS | 3 refills | Status: DC

## 2019-12-24 ENCOUNTER — Telehealth: Payer: Self-pay | Admitting: Podiatry

## 2019-12-24 NOTE — Telephone Encounter (Signed)
Pt called requesting a copy of her x-rays. I told her she would need to fill out and sign a medical records release form before I could release those to her. Pt requested I e-mail the form to her at [email or web address]. I told pt she would need to physically sign form as we do not accept electronic signatures.

## 2019-12-29 DIAGNOSIS — M23322 Other meniscus derangements, posterior horn of medial meniscus, left knee: Secondary | ICD-10-CM | POA: Diagnosis not present

## 2019-12-29 DIAGNOSIS — M2142 Flat foot [pes planus] (acquired), left foot: Secondary | ICD-10-CM | POA: Diagnosis not present

## 2019-12-29 DIAGNOSIS — M79672 Pain in left foot: Secondary | ICD-10-CM | POA: Diagnosis not present

## 2019-12-29 DIAGNOSIS — M1712 Unilateral primary osteoarthritis, left knee: Secondary | ICD-10-CM | POA: Diagnosis not present

## 2019-12-29 DIAGNOSIS — M25572 Pain in left ankle and joints of left foot: Secondary | ICD-10-CM | POA: Diagnosis not present

## 2019-12-29 DIAGNOSIS — Q666 Other congenital valgus deformities of feet: Secondary | ICD-10-CM | POA: Diagnosis not present

## 2019-12-30 DIAGNOSIS — E6609 Other obesity due to excess calories: Secondary | ICD-10-CM | POA: Diagnosis not present

## 2019-12-30 DIAGNOSIS — J3089 Other allergic rhinitis: Secondary | ICD-10-CM | POA: Diagnosis not present

## 2019-12-30 DIAGNOSIS — Z8249 Family history of ischemic heart disease and other diseases of the circulatory system: Secondary | ICD-10-CM | POA: Diagnosis not present

## 2019-12-30 DIAGNOSIS — Z713 Dietary counseling and surveillance: Secondary | ICD-10-CM | POA: Diagnosis not present

## 2019-12-30 DIAGNOSIS — E7841 Elevated Lipoprotein(a): Secondary | ICD-10-CM | POA: Diagnosis not present

## 2019-12-30 DIAGNOSIS — J301 Allergic rhinitis due to pollen: Secondary | ICD-10-CM | POA: Diagnosis not present

## 2020-01-13 DIAGNOSIS — E6609 Other obesity due to excess calories: Secondary | ICD-10-CM | POA: Diagnosis not present

## 2020-01-13 DIAGNOSIS — Z6836 Body mass index (BMI) 36.0-36.9, adult: Secondary | ICD-10-CM | POA: Diagnosis not present

## 2020-01-13 DIAGNOSIS — Z713 Dietary counseling and surveillance: Secondary | ICD-10-CM | POA: Diagnosis not present

## 2020-01-13 DIAGNOSIS — D649 Anemia, unspecified: Secondary | ICD-10-CM | POA: Diagnosis not present

## 2020-01-20 DIAGNOSIS — J301 Allergic rhinitis due to pollen: Secondary | ICD-10-CM | POA: Diagnosis not present

## 2020-01-20 DIAGNOSIS — Z833 Family history of diabetes mellitus: Secondary | ICD-10-CM | POA: Diagnosis not present

## 2020-01-20 DIAGNOSIS — Z713 Dietary counseling and surveillance: Secondary | ICD-10-CM | POA: Diagnosis not present

## 2020-01-20 DIAGNOSIS — J3089 Other allergic rhinitis: Secondary | ICD-10-CM | POA: Diagnosis not present

## 2020-01-20 DIAGNOSIS — Z6836 Body mass index (BMI) 36.0-36.9, adult: Secondary | ICD-10-CM | POA: Diagnosis not present

## 2020-01-20 DIAGNOSIS — E6609 Other obesity due to excess calories: Secondary | ICD-10-CM | POA: Diagnosis not present

## 2020-01-24 ENCOUNTER — Other Ambulatory Visit: Payer: Self-pay

## 2020-01-24 ENCOUNTER — Encounter: Payer: Self-pay | Admitting: Nurse Practitioner

## 2020-01-24 ENCOUNTER — Ambulatory Visit (INDEPENDENT_AMBULATORY_CARE_PROVIDER_SITE_OTHER): Payer: BC Managed Care – PPO | Admitting: Nurse Practitioner

## 2020-01-24 VITALS — BP 120/80 | HR 77 | Temp 98.7°F | Resp 18 | Ht 65.5 in | Wt 228.2 lb

## 2020-01-24 DIAGNOSIS — E041 Nontoxic single thyroid nodule: Secondary | ICD-10-CM

## 2020-01-24 DIAGNOSIS — M170 Bilateral primary osteoarthritis of knee: Secondary | ICD-10-CM

## 2020-01-24 DIAGNOSIS — Z13220 Encounter for screening for lipoid disorders: Secondary | ICD-10-CM | POA: Diagnosis not present

## 2020-01-24 DIAGNOSIS — J0100 Acute maxillary sinusitis, unspecified: Secondary | ICD-10-CM

## 2020-01-24 DIAGNOSIS — Z23 Encounter for immunization: Secondary | ICD-10-CM

## 2020-01-24 DIAGNOSIS — E559 Vitamin D deficiency, unspecified: Secondary | ICD-10-CM | POA: Diagnosis not present

## 2020-01-24 DIAGNOSIS — Z0000 Encounter for general adult medical examination without abnormal findings: Secondary | ICD-10-CM

## 2020-01-24 DIAGNOSIS — Z13228 Encounter for screening for other metabolic disorders: Secondary | ICD-10-CM

## 2020-01-24 MED ORDER — AZITHROMYCIN 250 MG PO TABS: ORAL_TABLET | ORAL | 0 refills | Status: DC

## 2020-01-24 MED ORDER — AZITHROMYCIN 250 MG PO TABS: ORAL_TABLET | ORAL | 0 refills | Status: AC

## 2020-01-24 MED ORDER — TERBINAFINE HCL 250 MG PO TABS: 250.0000 mg | ORAL_TABLET | Freq: Every day | ORAL | 2 refills | Status: AC

## 2020-01-24 MED ORDER — FLUCONAZOLE 150 MG PO TABS: 150.0000 mg | ORAL_TABLET | Freq: Once | ORAL | 0 refills | Status: AC

## 2020-01-24 NOTE — Addendum Note (Signed)
Addended by: Vic Blackbird F on: 01/24/2020 08:23 PM   Modules accepted: Orders

## 2020-01-24 NOTE — Progress Notes (Addendum)
Established Patient Office Visit  Subjective:  Patient ID: Angie Powell, female    DOB: [birth year]  Age: 54 y.o. MRN: [id number]  CC: Annual wellness   HPI Angie Powell is a 54 year old A.A female presenting for annual wellness visit.   Flu and Tdap due and pt desires today Mammogram, PAP completed 2021 with GYN and annually Cscope not due until 2025 Not sexually active for 3 years tested neg at GYN and no HIV or Hep Ctesting desired Health history and medications discussed and reviewed.  No cp/ct, gu/gi sxs, pain, edema, sob, or falls.   Past Medical History:  Diagnosis Date  . Arthritis   . Asthma   . Benign neoplasm of thyroid   . Depression   . Family history of diabetes mellitus   . GERD (gastroesophageal reflux disease)   . Heartburn   . Obesity   . OSA (obstructive sleep apnea)   . Right foot pain   . Tibialis tendinitis     Past Surgical History:  Procedure Laterality Date  . APPENDECTOMY    . right thyroidectomy, benign disease  06/2008  . TONSILLECTOMY    . TUBAL LIGATION      Family History  Problem Relation Age of Onset  . Cancer Mother        parathyroid   . Diabetes Mother   . Hyperlipidemia Mother   . Hypertension Mother   . Heart disease Maternal Grandmother   . Alcohol abuse Neg Hx   . Drug abuse Neg Hx   . Bipolar disorder Neg Hx   . Anxiety disorder Neg Hx   . Dementia Neg Hx   . Depression Neg Hx   . OCD Neg Hx   . Paranoid behavior Neg Hx   . Schizophrenia Neg Hx   . Seizures Neg Hx   . Sexual abuse Neg Hx   . Physical abuse Neg Hx   . Migraines Neg Hx     Social History   Socioeconomic History  . Marital status: Single    Spouse name: Not on file  . Number of children: 1  . Years of education: 52  . Highest education level: Not on file  Occupational History  . Occupation: Writer  Tobacco Use  . Smoking status: Never Smoker  . Smokeless tobacco: Never Used  Substance and Sexual Activity  . Alcohol use: No    Alcohol/week: 0.0 standard drinks  . Drug use: No  . Sexual activity: Yes  Other Topics Concern  . Not on file  Social History Narrative   Lives at home with daughter.   Caffeine use:  4 cup daily   Social Determinants of Health   Financial Resource Strain:   . Difficulty of Paying Living Expenses:   Food Insecurity:   . Worried About Charity fundraiser in the Last Year:   . Arboriculturist in the Last Year:   Transportation Needs:   . Film/video editor (Medical):   Marland Kitchen Lack of Transportation (Non-Medical):   Physical Activity:   . Days of Exercise per Week:   . Minutes of Exercise per Session:   Stress:   . Feeling of Stress :   Social Connections:   . Frequency of Communication with Friends and Family:   . Frequency of Social Gatherings with Friends and Family:   . Attends Religious Services:   . Active Member of Clubs or Organizations:   . Attends Archivist Meetings:   .  Marital Status:   Intimate Partner Violence:   . Fear of Current or Ex-Partner:   . Emotionally Abused:   Marland Kitchen Physically Abused:   . Sexually Abused:     Outpatient Medications Prior to Visit  Medication Sig Dispense Refill  . albuterol (PROVENTIL HFA) 108 (90 BASE) MCG/ACT inhaler Inhale 2 puffs into the lungs every 4 (four) hours as needed for wheezing. 1 Inhaler 3  . azelastine (ASTELIN) 0.1 % nasal spray Place into the nose.    . b complex vitamins tablet Take 1 tablet by mouth daily.    . beclomethasone (QVAR) 40 MCG/ACT inhaler Inhale 2 puffs into the lungs 2 (two) times daily.    . Cholecalciferol (VITAMIN D) 2000 units tablet Take 1 tablet (2,000 Units total) by mouth daily. 30 tablet 11  . EPINEPHrine 0.3 mg/0.3 mL IJ SOAJ injection AS DIRECTED INJECTION 30 DAYS    . furosemide (LASIX) 20 MG tablet Take 1 tablet (20 mg total) by mouth daily as needed. 90 tablet 2  . ibuprofen (ADVIL) 600 MG tablet Take 1 tablet (600 mg total) by mouth every 8 (eight) hours as needed. 60 tablet  0  . ketoconazole (NIZORAL) 2 % cream Apply topically 2 (two) times daily as needed for irritation. 15 g 1  . Loratadine 10 MG CAPS Take by mouth.    . mometasone (ELOCON) 0.1 % cream Apply 1 application topically 2 (two) times daily. 50 g 3  . montelukast (SINGULAIR) 10 MG tablet Take 10 mg by mouth at bedtime.      Marland Kitchen omeprazole-sodium bicarbonate (ZEGERID) 40-1100 MG capsule Take 1 capsule by mouth daily before breakfast. 90 capsule 3  . Pseudoephedrine-APAP 30-325 MG TABS Take by mouth.    . sodium chloride (OCEAN) 0.65 % nasal spray Place into the nose.    . triamcinolone (NASACORT) 55 MCG/ACT AERO nasal inhaler Place 2 sprays into the nose daily as needed.    . valACYclovir (VALTREX) 500 MG tablet take 1 tablet by mouth twice a day    . vitamin C (ASCORBIC ACID) 500 MG tablet Take 500 mg by mouth daily.    . Vitamin D, Ergocalciferol, (DRISDOL) 1.25 MG (50000 UT) CAPS capsule Take 1 capsule (50,000 Units total) by mouth every 7 (seven) days. 4 capsule 5  . azelastine (ASTEPRO) 137 MCG/SPRAY nasal spray 2 sprays by Nasal route 2 (two) times daily. Use in each nostril as directed     . HYDROcodone-acetaminophen (NORCO) 5-325 MG tablet Take 1 tablet by mouth every 6 (six) hours as needed for moderate pain. 20 tablet 0   No facility-administered medications prior to visit.    No Known Allergies  ROS Review of Systems  All other systems reviewed and are negative.     Objective:    Physical Exam  Constitutional: She is oriented to person, place, and time. She appears well-developed and well-nourished. She is cooperative.  Non-toxic appearance. She does not appear ill.  HENT:  Head: Normocephalic.  Right Ear: Hearing, tympanic membrane, external ear and ear canal normal.  Left Ear: Hearing, tympanic membrane, external ear and ear canal normal.  Nose: Mucosal edema and sinus tenderness present. No nasal deformity or septal deviation. No epistaxis.  No foreign bodies. Right sinus  exhibits maxillary sinus tenderness. Left sinus exhibits maxillary sinus tenderness.  Mouth/Throat: Uvula is midline, oropharynx is clear and moist and mucous membranes are normal.  Eyes: Pupils are equal, round, and reactive to light. Conjunctivae, EOM and lids are normal. Lids  are everted and swept, no foreign bodies found.  Neck: Trachea normal. No JVD present. Carotid bruit is not present. No thyroid mass and no thyromegaly present.  Cardiovascular: Normal rate, regular rhythm, S1 normal, S2 normal, normal heart sounds and normal pulses.  No edema  Pulmonary/Chest: Effort normal and breath sounds normal.  Abdominal: Soft. Normal appearance and bowel sounds are normal. There is no hepatosplenomegaly. There is no abdominal tenderness.  Musculoskeletal:        General: Normal range of motion.     Cervical back: Normal range of motion and neck supple.  Lymphadenopathy:       Head (right side): No submental, no submandibular, no tonsillar, no preauricular, no posterior auricular and no occipital adenopathy present.       Head (left side): No submental, no submandibular, no tonsillar, no preauricular, no posterior auricular and no occipital adenopathy present.    She has no cervical adenopathy.  Neurological: She is alert and oriented to person, place, and time. She has normal strength. No cranial nerve deficit. She displays a negative Romberg sign.  Skin: Skin is warm, dry and intact.  Psychiatric: She has a normal mood and affect. Her speech is normal and behavior is normal. Judgment and thought content normal.  Nursing note and vitals reviewed.   BP 120/80 (BP Location: Right Arm, Patient Position: Sitting, Cuff Size: Large)   Pulse 77   Temp 98.7 F (37.1 C) (Oral)   Resp 18   Ht 5' 5.5" (1.664 m)   Wt 228 lb 3.2 oz (103.5 kg)   SpO2 98%   BMI 37.40 kg/m  Wt Readings from Last 3 Encounters:  01/24/20 228 lb 3.2 oz (103.5 kg)  04/06/19 249 lb (112.9 kg)  11/09/18 236 lb 2 oz (107.1  kg)     Health Maintenance Due  Topic Date Due  . Hepatitis C Screening  Never done  . HIV Screening  Never done  . PAP SMEAR-Modifier  04/10/2018    There are no preventive care reminders to display for this patient.  Lab Results  Component Value Date   TSH 1.12 07/11/2017   Lab Results  Component Value Date   WBC 6.4 11/06/2018   HGB 12.9 11/06/2018   HCT 39.7 11/06/2018   MCV 87.4 11/06/2018   PLT 289 11/06/2018   Lab Results  Component Value Date   NA 140 11/06/2018   K 4.6 11/06/2018   CO2 27 11/06/2018   GLUCOSE 84 11/06/2018   BUN 17 11/06/2018   CREATININE 0.92 11/06/2018   BILITOT 0.6 11/06/2018   ALKPHOS 87 07/11/2017   AST 18 11/06/2018   ALT 16 11/06/2018   PROT 7.5 11/06/2018   ALBUMIN 4.2 07/11/2017   CALCIUM 9.9 11/06/2018   ANIONGAP 2 (L) 06/10/2013   Lab Results  Component Value Date   CHOL 198 11/06/2018   Lab Results  Component Value Date   HDL 57 11/06/2018   Lab Results  Component Value Date   LDLCALC 119 (H) 11/06/2018   Lab Results  Component Value Date   TRIG 112 11/06/2018   Lab Results  Component Value Date   CHOLHDL 3.5 11/06/2018   Lab Results  Component Value Date   HGBA1C 5.5 03/07/2011      Assessment & Plan:  1. Labs today: will call with abnormal's and direction otherwise available on mychart. 2. Repeat Annual in one year 3. Start and complete antibiotic for sinus infection, may take 4. diflucan as needed for vaginal  yeast infection 4. Flu and Tdap administered today  Problem List Items Addressed This Visit      Endocrine   Thyroid nodule   Relevant Orders   T4, free   TSH     Other   Vitamin D deficiency (Chronic)   Relevant Orders   VITAMIN D 25 Hydroxy (Vit-D Deficiency, Fractures)    Other Visit Diagnoses    Annual physical exam    -  Primary   Relevant Orders   CBC with Differential/Platelet   COMPLETE METABOLIC PANEL WITH GFR   Lipid panel   Lipid screening       Relevant Orders    Lipid panel   Screening for metabolic disorder       Relevant Orders   CBC with Differential/Platelet   COMPLETE METABOLIC PANEL WITH GFR   Immunization due       Relevant Orders   Tdap vaccine greater than or equal to 7yo IM (Completed)   Flu Vaccine QUAD 6+ mos PF IM (Fluarix Quad PF) (Completed)   Flu vaccine need       Acute non-recurrent maxillary sinusitis       Relevant Medications   azithromycin (ZITHROMAX) 250 MG tablet   fluconazole (DIFLUCAN) 150 MG tablet     Follow-up:  In one year for annual. Return for non resolving or worsening sinus symptoms.    Pt called back, stated her antibiotic did not go through, this was sent to the pharmacy Also stated her antifungal for her toenails was not called in. She had taken terbinafine in the past.  Will send in Terbinafine 250mg  daily for 12 weeks. Will need repeat LFT in 6 weeks.     Dr. Vic Blackbird  Annie Main, FNP

## 2020-01-24 NOTE — Addendum Note (Signed)
Addended by: Ishmael Holter on: 01/24/2020 04:34 PM   Modules accepted: Orders

## 2020-01-25 LAB — CBC WITH DIFFERENTIAL/PLATELET
Absolute Monocytes: 451 cells/uL (ref 200–950)
Basophils Absolute: 61 cells/uL (ref 0–200)
Basophils Relative: 1 %
Eosinophils Absolute: 159 cells/uL (ref 15–500)
Eosinophils Relative: 2.6 %
HCT: 39.6 % (ref 35.0–45.0)
Hemoglobin: 12.7 g/dL (ref 11.7–15.5)
Lymphs Abs: 2477 cells/uL (ref 850–3900)
MCH: 28.5 pg (ref 27.0–33.0)
MCHC: 32.1 g/dL (ref 32.0–36.0)
MCV: 89 fL (ref 80.0–100.0)
MPV: 11 fL (ref 7.5–12.5)
Monocytes Relative: 7.4 %
Neutro Abs: 2952 cells/uL (ref 1500–7800)
Neutrophils Relative %: 48.4 %
Platelets: 294 10*3/uL (ref 140–400)
RBC: 4.45 10*6/uL (ref 3.80–5.10)
RDW: 12.4 % (ref 11.0–15.0)
Total Lymphocyte: 40.6 %
WBC: 6.1 10*3/uL (ref 3.8–10.8)

## 2020-01-25 LAB — LIPID PANEL
Cholesterol: 179 mg/dL (ref <200)
HDL: 53 mg/dL (ref >=50)
LDL Cholesterol (Calc): 110 mg/dL (calc) — ABNORMAL HIGH
Non-HDL Cholesterol (Calc): 126 mg/dL (calc) (ref <130)
Total CHOL/HDL Ratio: 3.4 (calc) (ref <5.0)
Triglycerides: 72 mg/dL (ref <150)

## 2020-01-25 LAB — TSH: TSH: 1.27 mIU/L (ref 0.40–4.50)

## 2020-01-25 LAB — COMPREHENSIVE METABOLIC PANEL
AG Ratio: 1.4 (calc) (ref 1.0–2.5)
ALT: 17 U/L (ref 6–29)
AST: 19 U/L (ref 10–35)
Albumin: 4 g/dL (ref 3.6–5.1)
Alkaline phosphatase (APISO): 74 U/L (ref 37–153)
BUN: 10 mg/dL (ref 7–25)
CO2: 27 mmol/L (ref 20–32)
Calcium: 9.3 mg/dL (ref 8.6–10.4)
Chloride: 109 mmol/L (ref 98–110)
Creat: 0.84 mg/dL (ref 0.50–1.05)
Globulin: 2.8 g/dL (calc) (ref 1.9–3.7)
Glucose, Bld: 99 mg/dL (ref 65–99)
Potassium: 4.5 mmol/L (ref 3.5–5.3)
Sodium: 143 mmol/L (ref 135–146)
Total Bilirubin: 0.5 mg/dL (ref 0.2–1.2)
Total Protein: 6.8 g/dL (ref 6.1–8.1)

## 2020-01-25 LAB — TEST AUTHORIZATION

## 2020-01-25 LAB — VITAMIN D 25 HYDROXY (VIT D DEFICIENCY, FRACTURES): Vit D, 25-Hydroxy: 14 ng/mL — ABNORMAL LOW (ref 30–100)

## 2020-01-25 LAB — T4, FREE: Free T4: 1 ng/dL (ref 0.8–1.8)

## 2020-01-26 ENCOUNTER — Other Ambulatory Visit: Payer: Self-pay | Admitting: *Deleted

## 2020-01-26 DIAGNOSIS — B351 Tinea unguium: Secondary | ICD-10-CM

## 2020-01-26 DIAGNOSIS — Z79899 Other long term (current) drug therapy: Secondary | ICD-10-CM

## 2020-01-26 MED ORDER — VITAMIN D (ERGOCALCIFEROL) 1.25 MG (50000 UNIT) PO CAPS: 50000.0000 [IU] | ORAL_CAPSULE | ORAL | 5 refills | Status: AC

## 2020-01-27 DIAGNOSIS — J3089 Other allergic rhinitis: Secondary | ICD-10-CM | POA: Diagnosis not present

## 2020-01-27 DIAGNOSIS — J301 Allergic rhinitis due to pollen: Secondary | ICD-10-CM | POA: Diagnosis not present

## 2020-02-03 DIAGNOSIS — D649 Anemia, unspecified: Secondary | ICD-10-CM | POA: Diagnosis not present

## 2020-02-03 DIAGNOSIS — Z713 Dietary counseling and surveillance: Secondary | ICD-10-CM | POA: Diagnosis not present

## 2020-02-03 DIAGNOSIS — Z6836 Body mass index (BMI) 36.0-36.9, adult: Secondary | ICD-10-CM | POA: Diagnosis not present

## 2020-02-03 DIAGNOSIS — J3089 Other allergic rhinitis: Secondary | ICD-10-CM | POA: Diagnosis not present

## 2020-02-03 DIAGNOSIS — J301 Allergic rhinitis due to pollen: Secondary | ICD-10-CM | POA: Diagnosis not present

## 2020-02-03 DIAGNOSIS — E6609 Other obesity due to excess calories: Secondary | ICD-10-CM | POA: Diagnosis not present

## 2020-02-10 DIAGNOSIS — J3089 Other allergic rhinitis: Secondary | ICD-10-CM | POA: Diagnosis not present

## 2020-02-10 DIAGNOSIS — J301 Allergic rhinitis due to pollen: Secondary | ICD-10-CM | POA: Diagnosis not present

## 2020-02-17 ENCOUNTER — Ambulatory Visit: Payer: BC Managed Care – PPO | Admitting: Orthopedic Surgery

## 2020-02-17 DIAGNOSIS — E6609 Other obesity due to excess calories: Secondary | ICD-10-CM | POA: Diagnosis not present

## 2020-02-17 DIAGNOSIS — Z713 Dietary counseling and surveillance: Secondary | ICD-10-CM | POA: Diagnosis not present

## 2020-02-17 DIAGNOSIS — J45909 Unspecified asthma, uncomplicated: Secondary | ICD-10-CM | POA: Diagnosis not present

## 2020-02-17 DIAGNOSIS — Z6836 Body mass index (BMI) 36.0-36.9, adult: Secondary | ICD-10-CM | POA: Diagnosis not present

## 2020-02-24 DIAGNOSIS — E6609 Other obesity due to excess calories: Secondary | ICD-10-CM | POA: Diagnosis not present

## 2020-02-24 DIAGNOSIS — Z6836 Body mass index (BMI) 36.0-36.9, adult: Secondary | ICD-10-CM | POA: Diagnosis not present

## 2020-02-24 DIAGNOSIS — J3089 Other allergic rhinitis: Secondary | ICD-10-CM | POA: Diagnosis not present

## 2020-02-24 DIAGNOSIS — Z713 Dietary counseling and surveillance: Secondary | ICD-10-CM | POA: Diagnosis not present

## 2020-02-24 DIAGNOSIS — J301 Allergic rhinitis due to pollen: Secondary | ICD-10-CM | POA: Diagnosis not present

## 2020-02-24 DIAGNOSIS — Z833 Family history of diabetes mellitus: Secondary | ICD-10-CM | POA: Diagnosis not present

## 2020-03-02 ENCOUNTER — Ambulatory Visit: Payer: BC Managed Care – PPO | Admitting: Orthopedic Surgery

## 2020-03-02 DIAGNOSIS — J3089 Other allergic rhinitis: Secondary | ICD-10-CM | POA: Diagnosis not present

## 2020-03-02 DIAGNOSIS — J301 Allergic rhinitis due to pollen: Secondary | ICD-10-CM | POA: Diagnosis not present

## 2020-03-02 DIAGNOSIS — Z6835 Body mass index (BMI) 35.0-35.9, adult: Secondary | ICD-10-CM | POA: Diagnosis not present

## 2020-03-02 DIAGNOSIS — Z713 Dietary counseling and surveillance: Secondary | ICD-10-CM | POA: Diagnosis not present

## 2020-03-02 DIAGNOSIS — E6609 Other obesity due to excess calories: Secondary | ICD-10-CM | POA: Diagnosis not present

## 2020-03-06 DIAGNOSIS — J301 Allergic rhinitis due to pollen: Secondary | ICD-10-CM | POA: Diagnosis not present

## 2020-03-06 DIAGNOSIS — J3089 Other allergic rhinitis: Secondary | ICD-10-CM | POA: Diagnosis not present

## 2020-03-09 DIAGNOSIS — J3089 Other allergic rhinitis: Secondary | ICD-10-CM | POA: Diagnosis not present

## 2020-03-09 DIAGNOSIS — M1712 Unilateral primary osteoarthritis, left knee: Secondary | ICD-10-CM | POA: Insufficient documentation

## 2020-03-09 DIAGNOSIS — J301 Allergic rhinitis due to pollen: Secondary | ICD-10-CM | POA: Diagnosis not present

## 2020-03-09 DIAGNOSIS — M23322 Other meniscus derangements, posterior horn of medial meniscus, left knee: Secondary | ICD-10-CM | POA: Diagnosis not present

## 2020-03-10 DIAGNOSIS — E6609 Other obesity due to excess calories: Secondary | ICD-10-CM | POA: Diagnosis not present

## 2020-03-10 DIAGNOSIS — M150 Primary generalized (osteo)arthritis: Secondary | ICD-10-CM | POA: Diagnosis not present

## 2020-03-10 DIAGNOSIS — Z6836 Body mass index (BMI) 36.0-36.9, adult: Secondary | ICD-10-CM | POA: Diagnosis not present

## 2020-03-10 DIAGNOSIS — Z713 Dietary counseling and surveillance: Secondary | ICD-10-CM | POA: Diagnosis not present

## 2020-04-05 DIAGNOSIS — Z6836 Body mass index (BMI) 36.0-36.9, adult: Secondary | ICD-10-CM | POA: Diagnosis not present

## 2020-04-05 DIAGNOSIS — D649 Anemia, unspecified: Secondary | ICD-10-CM | POA: Diagnosis not present

## 2020-04-05 DIAGNOSIS — Z713 Dietary counseling and surveillance: Secondary | ICD-10-CM | POA: Diagnosis not present

## 2020-04-05 DIAGNOSIS — E6609 Other obesity due to excess calories: Secondary | ICD-10-CM | POA: Diagnosis not present

## 2020-04-13 DIAGNOSIS — E7841 Elevated Lipoprotein(a): Secondary | ICD-10-CM | POA: Diagnosis not present

## 2020-04-13 DIAGNOSIS — Z6836 Body mass index (BMI) 36.0-36.9, adult: Secondary | ICD-10-CM | POA: Diagnosis not present

## 2020-04-13 DIAGNOSIS — Z713 Dietary counseling and surveillance: Secondary | ICD-10-CM | POA: Diagnosis not present

## 2020-04-13 DIAGNOSIS — E6609 Other obesity due to excess calories: Secondary | ICD-10-CM | POA: Diagnosis not present

## 2020-04-20 DIAGNOSIS — E7841 Elevated Lipoprotein(a): Secondary | ICD-10-CM | POA: Diagnosis not present

## 2020-04-20 DIAGNOSIS — J3089 Other allergic rhinitis: Secondary | ICD-10-CM | POA: Diagnosis not present

## 2020-04-20 DIAGNOSIS — E6609 Other obesity due to excess calories: Secondary | ICD-10-CM | POA: Diagnosis not present

## 2020-04-20 DIAGNOSIS — Z713 Dietary counseling and surveillance: Secondary | ICD-10-CM | POA: Diagnosis not present

## 2020-04-20 DIAGNOSIS — J301 Allergic rhinitis due to pollen: Secondary | ICD-10-CM | POA: Diagnosis not present

## 2020-04-20 DIAGNOSIS — Z6836 Body mass index (BMI) 36.0-36.9, adult: Secondary | ICD-10-CM | POA: Diagnosis not present

## 2020-04-27 DIAGNOSIS — J4540 Moderate persistent asthma, uncomplicated: Secondary | ICD-10-CM | POA: Diagnosis not present

## 2020-04-27 DIAGNOSIS — J3089 Other allergic rhinitis: Secondary | ICD-10-CM | POA: Diagnosis not present

## 2020-04-27 DIAGNOSIS — J301 Allergic rhinitis due to pollen: Secondary | ICD-10-CM | POA: Diagnosis not present

## 2020-04-28 DIAGNOSIS — Z713 Dietary counseling and surveillance: Secondary | ICD-10-CM | POA: Diagnosis not present

## 2020-04-28 DIAGNOSIS — Z6836 Body mass index (BMI) 36.0-36.9, adult: Secondary | ICD-10-CM | POA: Diagnosis not present

## 2020-04-28 DIAGNOSIS — E6609 Other obesity due to excess calories: Secondary | ICD-10-CM | POA: Diagnosis not present

## 2020-04-28 DIAGNOSIS — E7841 Elevated Lipoprotein(a): Secondary | ICD-10-CM | POA: Diagnosis not present

## 2020-05-04 DIAGNOSIS — M25561 Pain in right knee: Secondary | ICD-10-CM | POA: Diagnosis not present

## 2020-05-05 DIAGNOSIS — E7841 Elevated Lipoprotein(a): Secondary | ICD-10-CM | POA: Diagnosis not present

## 2020-05-05 DIAGNOSIS — Z6835 Body mass index (BMI) 35.0-35.9, adult: Secondary | ICD-10-CM | POA: Diagnosis not present

## 2020-05-05 DIAGNOSIS — E6609 Other obesity due to excess calories: Secondary | ICD-10-CM | POA: Diagnosis not present

## 2020-05-05 DIAGNOSIS — Z713 Dietary counseling and surveillance: Secondary | ICD-10-CM | POA: Diagnosis not present

## 2020-05-11 DIAGNOSIS — E6609 Other obesity due to excess calories: Secondary | ICD-10-CM | POA: Diagnosis not present

## 2020-05-11 DIAGNOSIS — Z713 Dietary counseling and surveillance: Secondary | ICD-10-CM | POA: Diagnosis not present

## 2020-05-11 DIAGNOSIS — E7841 Elevated Lipoprotein(a): Secondary | ICD-10-CM | POA: Diagnosis not present

## 2020-05-11 DIAGNOSIS — J301 Allergic rhinitis due to pollen: Secondary | ICD-10-CM | POA: Diagnosis not present

## 2020-05-11 DIAGNOSIS — Z6835 Body mass index (BMI) 35.0-35.9, adult: Secondary | ICD-10-CM | POA: Diagnosis not present

## 2020-05-11 DIAGNOSIS — J3089 Other allergic rhinitis: Secondary | ICD-10-CM | POA: Diagnosis not present

## 2020-05-19 DIAGNOSIS — E7841 Elevated Lipoprotein(a): Secondary | ICD-10-CM | POA: Diagnosis not present

## 2020-05-19 DIAGNOSIS — Z6835 Body mass index (BMI) 35.0-35.9, adult: Secondary | ICD-10-CM | POA: Diagnosis not present

## 2020-05-19 DIAGNOSIS — E6609 Other obesity due to excess calories: Secondary | ICD-10-CM | POA: Diagnosis not present

## 2020-05-19 DIAGNOSIS — Z713 Dietary counseling and surveillance: Secondary | ICD-10-CM | POA: Diagnosis not present

## 2020-05-25 DIAGNOSIS — J3089 Other allergic rhinitis: Secondary | ICD-10-CM | POA: Diagnosis not present

## 2020-05-25 DIAGNOSIS — J301 Allergic rhinitis due to pollen: Secondary | ICD-10-CM | POA: Diagnosis not present

## 2020-05-26 DIAGNOSIS — Z713 Dietary counseling and surveillance: Secondary | ICD-10-CM | POA: Diagnosis not present

## 2020-05-26 DIAGNOSIS — E7841 Elevated Lipoprotein(a): Secondary | ICD-10-CM | POA: Diagnosis not present

## 2020-05-26 DIAGNOSIS — E6609 Other obesity due to excess calories: Secondary | ICD-10-CM | POA: Diagnosis not present

## 2020-05-26 DIAGNOSIS — Z6835 Body mass index (BMI) 35.0-35.9, adult: Secondary | ICD-10-CM | POA: Diagnosis not present

## 2020-06-08 DIAGNOSIS — J301 Allergic rhinitis due to pollen: Secondary | ICD-10-CM | POA: Diagnosis not present

## 2020-06-08 DIAGNOSIS — J3089 Other allergic rhinitis: Secondary | ICD-10-CM | POA: Diagnosis not present

## 2020-06-09 DIAGNOSIS — Z6835 Body mass index (BMI) 35.0-35.9, adult: Secondary | ICD-10-CM | POA: Diagnosis not present

## 2020-06-09 DIAGNOSIS — Z713 Dietary counseling and surveillance: Secondary | ICD-10-CM | POA: Diagnosis not present

## 2020-06-09 DIAGNOSIS — E6609 Other obesity due to excess calories: Secondary | ICD-10-CM | POA: Diagnosis not present

## 2020-06-09 DIAGNOSIS — E7841 Elevated Lipoprotein(a): Secondary | ICD-10-CM | POA: Diagnosis not present

## 2020-06-23 DIAGNOSIS — Z713 Dietary counseling and surveillance: Secondary | ICD-10-CM | POA: Diagnosis not present

## 2020-06-23 DIAGNOSIS — E6609 Other obesity due to excess calories: Secondary | ICD-10-CM | POA: Diagnosis not present

## 2020-06-23 DIAGNOSIS — E7841 Elevated Lipoprotein(a): Secondary | ICD-10-CM | POA: Diagnosis not present

## 2020-06-23 DIAGNOSIS — Z6835 Body mass index (BMI) 35.0-35.9, adult: Secondary | ICD-10-CM | POA: Diagnosis not present

## 2020-06-30 DIAGNOSIS — Z713 Dietary counseling and surveillance: Secondary | ICD-10-CM | POA: Diagnosis not present

## 2020-06-30 DIAGNOSIS — E7841 Elevated Lipoprotein(a): Secondary | ICD-10-CM | POA: Diagnosis not present

## 2020-06-30 DIAGNOSIS — Z6835 Body mass index (BMI) 35.0-35.9, adult: Secondary | ICD-10-CM | POA: Diagnosis not present

## 2020-06-30 DIAGNOSIS — E6609 Other obesity due to excess calories: Secondary | ICD-10-CM | POA: Diagnosis not present

## 2020-06-30 DIAGNOSIS — M25561 Pain in right knee: Secondary | ICD-10-CM | POA: Diagnosis not present

## 2020-06-30 DIAGNOSIS — S83241A Other tear of medial meniscus, current injury, right knee, initial encounter: Secondary | ICD-10-CM | POA: Diagnosis not present

## 2020-07-07 DIAGNOSIS — S83241A Other tear of medial meniscus, current injury, right knee, initial encounter: Secondary | ICD-10-CM | POA: Diagnosis not present

## 2020-07-13 DIAGNOSIS — J301 Allergic rhinitis due to pollen: Secondary | ICD-10-CM | POA: Diagnosis not present

## 2020-07-13 DIAGNOSIS — J3089 Other allergic rhinitis: Secondary | ICD-10-CM | POA: Diagnosis not present

## 2020-07-15 DIAGNOSIS — R300 Dysuria: Secondary | ICD-10-CM | POA: Diagnosis not present

## 2020-07-15 DIAGNOSIS — N3001 Acute cystitis with hematuria: Secondary | ICD-10-CM | POA: Diagnosis not present

## 2020-07-20 DIAGNOSIS — M1711 Unilateral primary osteoarthritis, right knee: Secondary | ICD-10-CM | POA: Diagnosis not present

## 2020-07-21 DIAGNOSIS — J3089 Other allergic rhinitis: Secondary | ICD-10-CM | POA: Diagnosis not present

## 2020-07-21 DIAGNOSIS — J301 Allergic rhinitis due to pollen: Secondary | ICD-10-CM | POA: Diagnosis not present

## 2020-07-26 DIAGNOSIS — Z6835 Body mass index (BMI) 35.0-35.9, adult: Secondary | ICD-10-CM | POA: Diagnosis not present

## 2020-07-26 DIAGNOSIS — E6609 Other obesity due to excess calories: Secondary | ICD-10-CM | POA: Diagnosis not present

## 2020-07-26 DIAGNOSIS — E7841 Elevated Lipoprotein(a): Secondary | ICD-10-CM | POA: Diagnosis not present

## 2020-07-26 DIAGNOSIS — Z713 Dietary counseling and surveillance: Secondary | ICD-10-CM | POA: Diagnosis not present

## 2020-07-27 DIAGNOSIS — J3089 Other allergic rhinitis: Secondary | ICD-10-CM | POA: Diagnosis not present

## 2020-07-27 DIAGNOSIS — J301 Allergic rhinitis due to pollen: Secondary | ICD-10-CM | POA: Diagnosis not present

## 2020-08-04 DIAGNOSIS — J301 Allergic rhinitis due to pollen: Secondary | ICD-10-CM | POA: Diagnosis not present

## 2020-08-04 DIAGNOSIS — Z6834 Body mass index (BMI) 34.0-34.9, adult: Secondary | ICD-10-CM | POA: Diagnosis not present

## 2020-08-04 DIAGNOSIS — E6609 Other obesity due to excess calories: Secondary | ICD-10-CM | POA: Diagnosis not present

## 2020-08-04 DIAGNOSIS — J3089 Other allergic rhinitis: Secondary | ICD-10-CM | POA: Diagnosis not present

## 2020-08-04 DIAGNOSIS — E7841 Elevated Lipoprotein(a): Secondary | ICD-10-CM | POA: Diagnosis not present

## 2020-08-04 DIAGNOSIS — Z713 Dietary counseling and surveillance: Secondary | ICD-10-CM | POA: Diagnosis not present

## 2020-08-10 DIAGNOSIS — J301 Allergic rhinitis due to pollen: Secondary | ICD-10-CM | POA: Diagnosis not present

## 2020-08-10 DIAGNOSIS — J3089 Other allergic rhinitis: Secondary | ICD-10-CM | POA: Diagnosis not present

## 2020-08-17 DIAGNOSIS — J3089 Other allergic rhinitis: Secondary | ICD-10-CM | POA: Diagnosis not present

## 2020-08-17 DIAGNOSIS — J301 Allergic rhinitis due to pollen: Secondary | ICD-10-CM | POA: Diagnosis not present

## 2020-08-24 DIAGNOSIS — Z20822 Contact with and (suspected) exposure to covid-19: Secondary | ICD-10-CM | POA: Diagnosis not present

## 2020-08-24 DIAGNOSIS — J3089 Other allergic rhinitis: Secondary | ICD-10-CM | POA: Diagnosis not present

## 2020-08-24 DIAGNOSIS — J301 Allergic rhinitis due to pollen: Secondary | ICD-10-CM | POA: Diagnosis not present

## 2020-08-24 DIAGNOSIS — Z03818 Encounter for observation for suspected exposure to other biological agents ruled out: Secondary | ICD-10-CM | POA: Diagnosis not present

## 2020-08-31 DIAGNOSIS — Z01419 Encounter for gynecological examination (general) (routine) without abnormal findings: Secondary | ICD-10-CM | POA: Diagnosis not present

## 2020-08-31 DIAGNOSIS — Z6835 Body mass index (BMI) 35.0-35.9, adult: Secondary | ICD-10-CM | POA: Diagnosis not present

## 2020-09-17 DIAGNOSIS — R300 Dysuria: Secondary | ICD-10-CM | POA: Diagnosis not present

## 2020-09-17 DIAGNOSIS — N390 Urinary tract infection, site not specified: Secondary | ICD-10-CM | POA: Diagnosis not present

## 2020-09-19 DIAGNOSIS — R1031 Right lower quadrant pain: Secondary | ICD-10-CM | POA: Diagnosis not present

## 2020-09-19 DIAGNOSIS — R9389 Abnormal findings on diagnostic imaging of other specified body structures: Secondary | ICD-10-CM | POA: Diagnosis not present

## 2020-09-19 DIAGNOSIS — Z6834 Body mass index (BMI) 34.0-34.9, adult: Secondary | ICD-10-CM | POA: Diagnosis not present

## 2020-09-19 DIAGNOSIS — N858 Other specified noninflammatory disorders of uterus: Secondary | ICD-10-CM | POA: Diagnosis not present

## 2020-09-19 DIAGNOSIS — D252 Subserosal leiomyoma of uterus: Secondary | ICD-10-CM | POA: Diagnosis not present

## 2020-09-19 DIAGNOSIS — R300 Dysuria: Secondary | ICD-10-CM | POA: Diagnosis not present

## 2020-09-19 DIAGNOSIS — R109 Unspecified abdominal pain: Secondary | ICD-10-CM | POA: Diagnosis not present

## 2020-09-19 DIAGNOSIS — N3000 Acute cystitis without hematuria: Secondary | ICD-10-CM | POA: Diagnosis not present

## 2020-09-19 DIAGNOSIS — R102 Pelvic and perineal pain: Secondary | ICD-10-CM | POA: Diagnosis not present

## 2020-09-19 DIAGNOSIS — M545 Low back pain, unspecified: Secondary | ICD-10-CM | POA: Diagnosis not present

## 2020-09-19 DIAGNOSIS — J45909 Unspecified asthma, uncomplicated: Secondary | ICD-10-CM | POA: Diagnosis not present

## 2020-09-19 DIAGNOSIS — Z87891 Personal history of nicotine dependence: Secondary | ICD-10-CM | POA: Diagnosis not present

## 2020-09-19 DIAGNOSIS — N3001 Acute cystitis with hematuria: Secondary | ICD-10-CM | POA: Diagnosis not present

## 2020-09-21 DIAGNOSIS — J301 Allergic rhinitis due to pollen: Secondary | ICD-10-CM | POA: Diagnosis not present

## 2020-09-21 DIAGNOSIS — J3089 Other allergic rhinitis: Secondary | ICD-10-CM | POA: Diagnosis not present

## 2020-09-26 DIAGNOSIS — J301 Allergic rhinitis due to pollen: Secondary | ICD-10-CM | POA: Diagnosis not present

## 2020-09-26 DIAGNOSIS — J3089 Other allergic rhinitis: Secondary | ICD-10-CM | POA: Diagnosis not present

## 2020-09-26 DIAGNOSIS — J3081 Allergic rhinitis due to animal (cat) (dog) hair and dander: Secondary | ICD-10-CM | POA: Diagnosis not present

## 2020-10-03 DIAGNOSIS — J301 Allergic rhinitis due to pollen: Secondary | ICD-10-CM | POA: Diagnosis not present

## 2020-10-03 DIAGNOSIS — J3089 Other allergic rhinitis: Secondary | ICD-10-CM | POA: Diagnosis not present

## 2020-10-13 DIAGNOSIS — J301 Allergic rhinitis due to pollen: Secondary | ICD-10-CM | POA: Diagnosis not present

## 2020-10-13 DIAGNOSIS — J3089 Other allergic rhinitis: Secondary | ICD-10-CM | POA: Diagnosis not present

## 2021-04-24 IMAGING — MG MM DIGITAL DIAGNOSTIC BILAT W/ TOMO W/ CAD
8 series · 8 of 24 positions shown · non-contrast
Comparison: Previous exam(s).

CLINICAL DATA: Patient presents for follow-up of a probably benign
right breast mass that was last evaluated in January 2018.

EXAM:
DIGITAL DIAGNOSTIC BILATERAL MAMMOGRAM WITH CAD AND TOMO
ULTRASOUND RIGHT BREAST

[R MLO synth-2D]
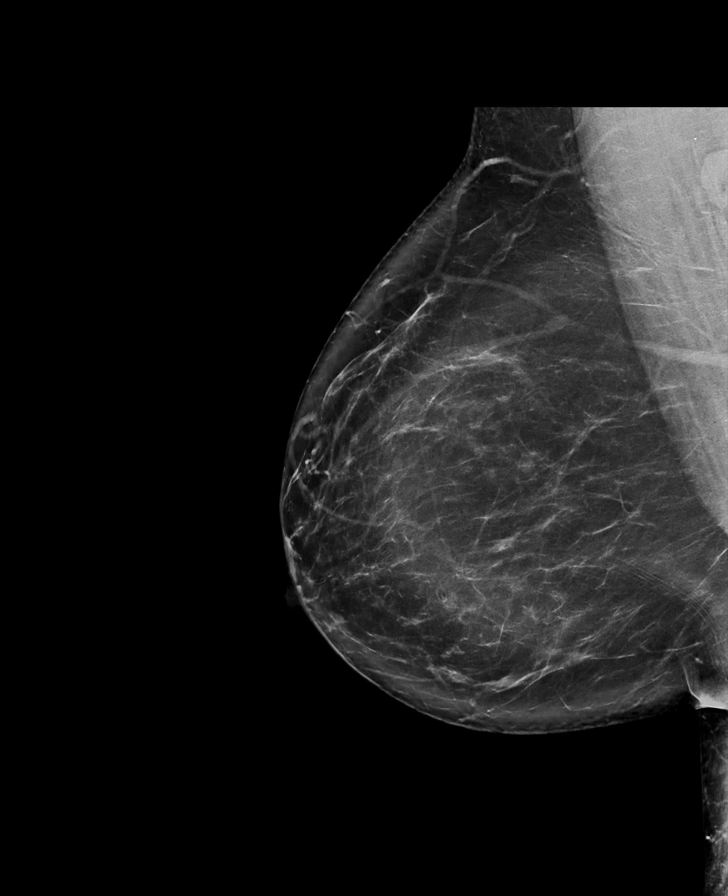

[L MLO synth-2D]
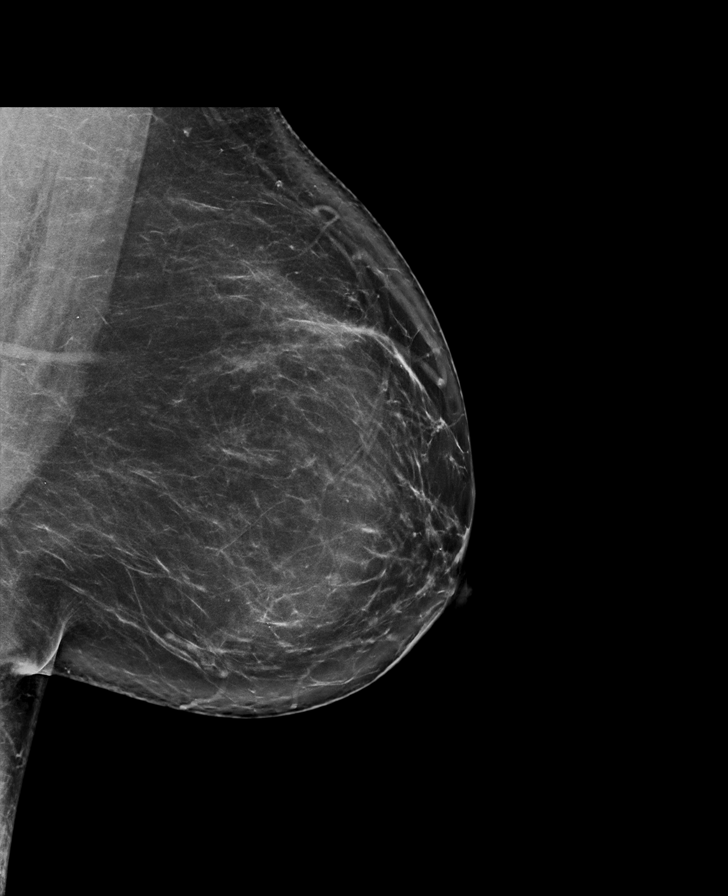

[L CC synth-2D]
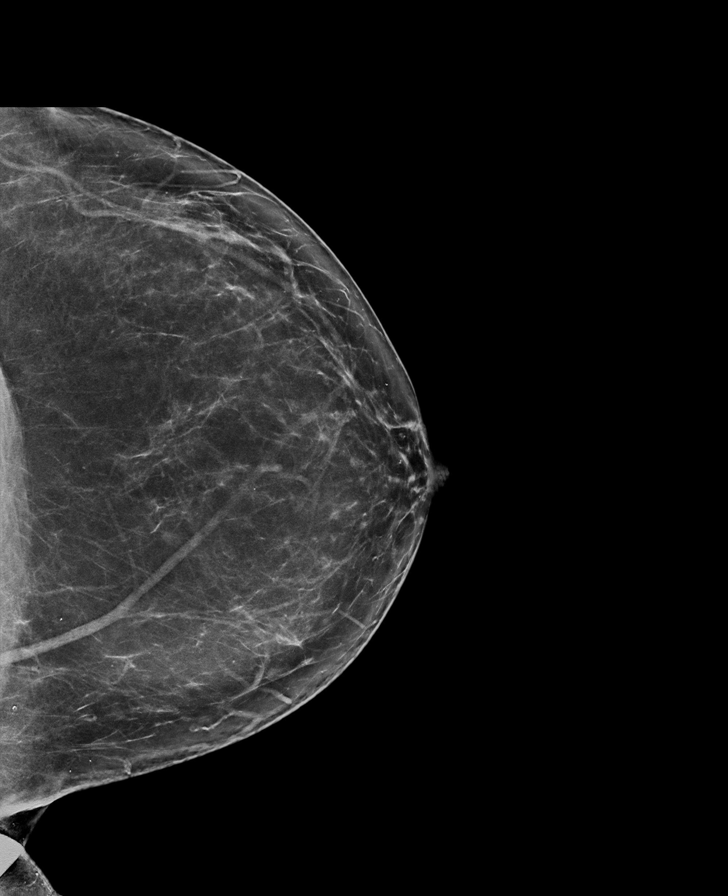

[R CC synth-2D]
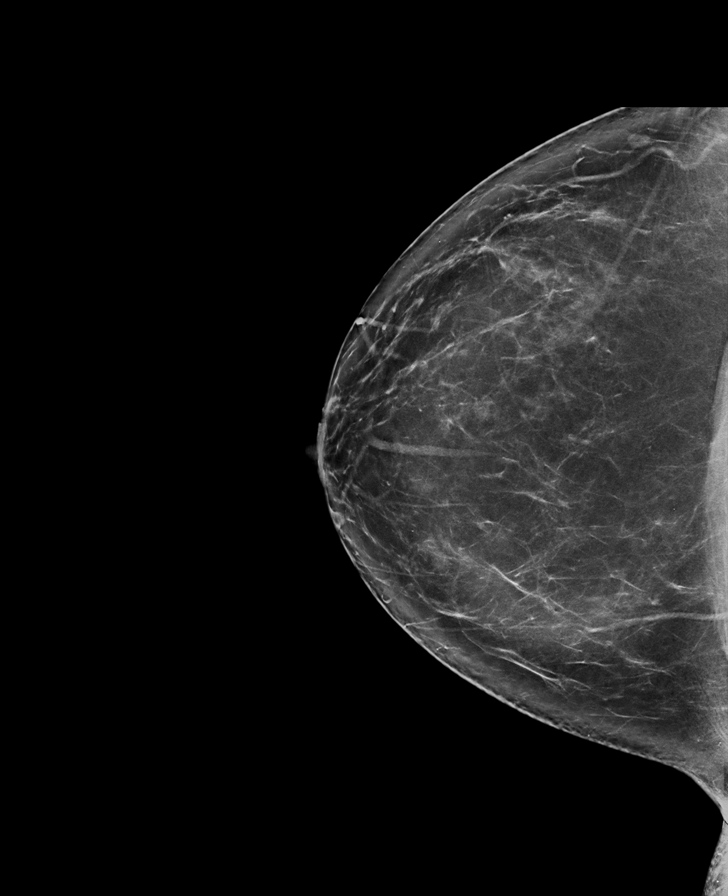

[R MLO tomo · tomo slice 49/96.0]
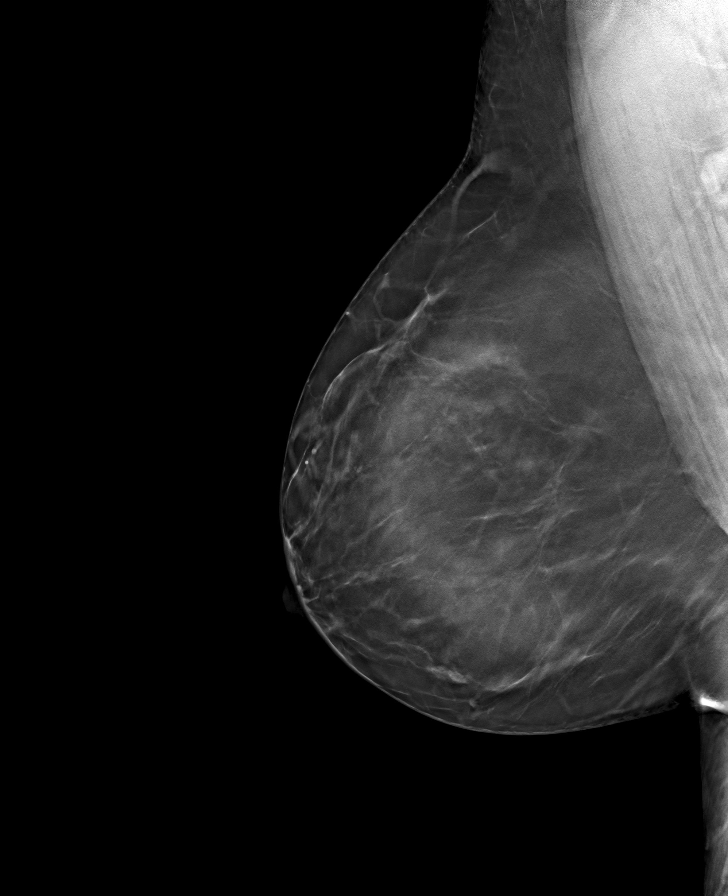

[L MLO tomo · tomo slice 47/93.0]
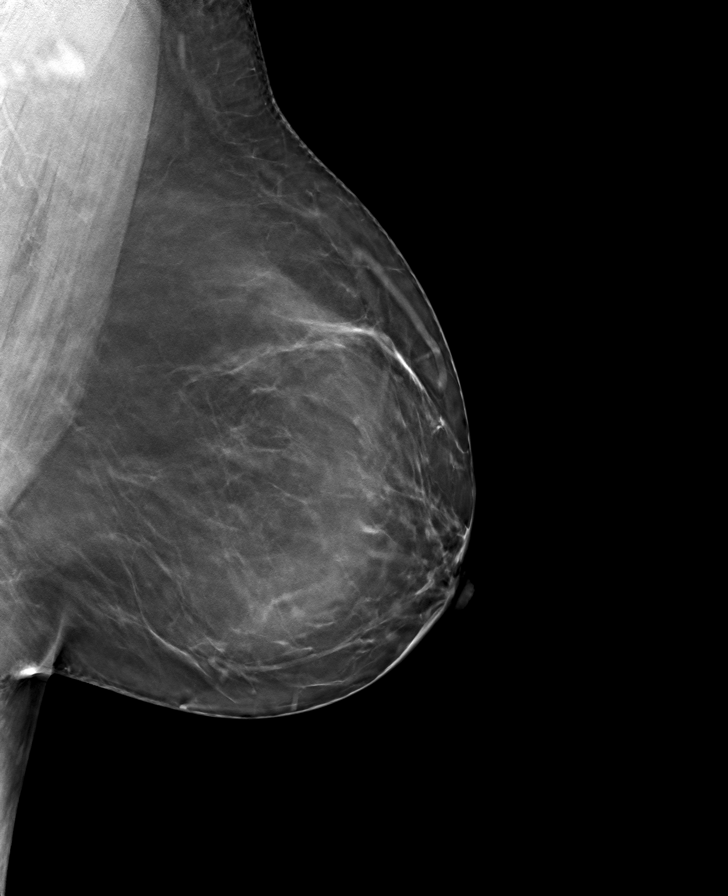

[R CC tomo · tomo slice 45/88.0]
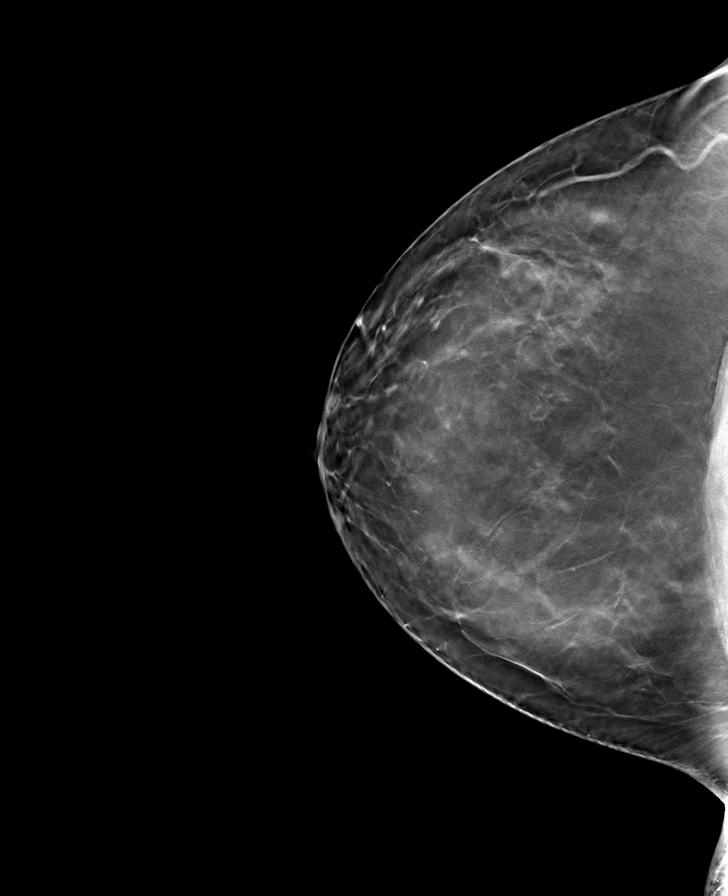

[L CC tomo · tomo slice 43/84.0]
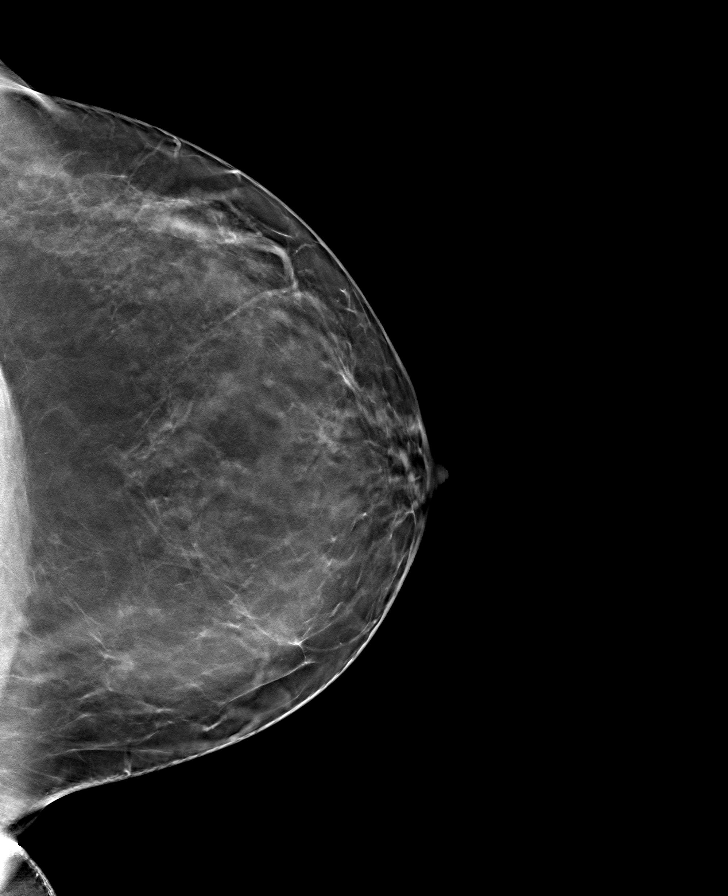

[8 of 24 positions shown; findings below may reference images not displayed]

ACR Breast Density Category b: There are scattered areas of
fibroglandular density.
FINDINGS: Mammogram: No suspicious mass, distortion, or microcalcifications
are identified to suggest presence of malignancy in the bilateral
breasts.

Mammographic images were processed with CAD.

Targeted ultrasound is performed, showing an oval hypoechoic mass
measuring 0.6 x 0.2 x 0.5 cm, previously 0.5 x 0.2 x 0.6 cm, located
within the skin at the 2 o'clock retroareolar region. There is a
probable pore visualized leading to the skin.
IMPRESSION: Right breast 6 mm mass at the 2 o'clock retroareolar region located
within the skin, stable for over a year. This is considered a benign
finding given location in the skin.

RECOMMENDATION:
Screening mammogram in one year.(Code:MM-O-J81)

I have discussed the findings and recommendations with the patient.
If applicable, a reminder letter will be sent to the patient
regarding the next appointment.

BI-RADS CATEGORY  2: Benign.

## 2021-04-24 IMAGING — US US BREAST*R* LIMITED INC AXILLA
1 series · 7 of 7 positions shown · non-contrast
Comparison: Previous exam(s).

CLINICAL DATA: Patient presents for follow-up of a probably benign
right breast mass that was last evaluated in January 2018.

EXAM:
DIGITAL DIAGNOSTIC BILATERAL MAMMOGRAM WITH CAD AND TOMO
ULTRASOUND RIGHT BREAST

[Series 1: us breast*right* limited inc axilla · 0.04mm/px · 7 of 7 slices shown]
[im 1/7]
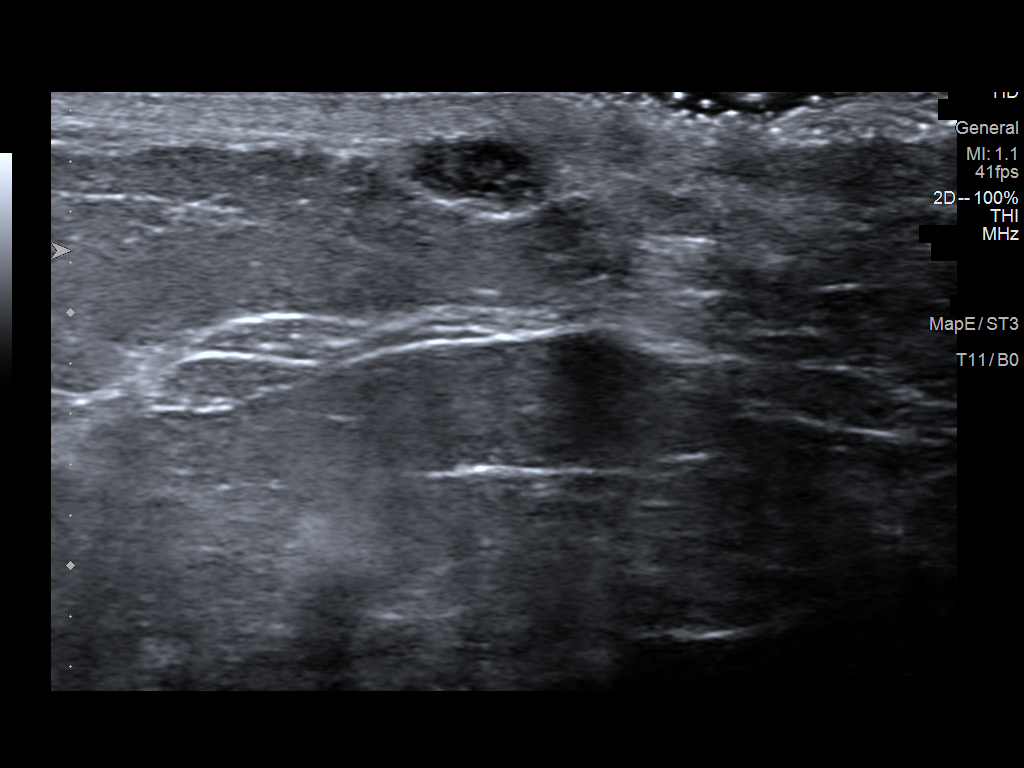
[im 2/7]
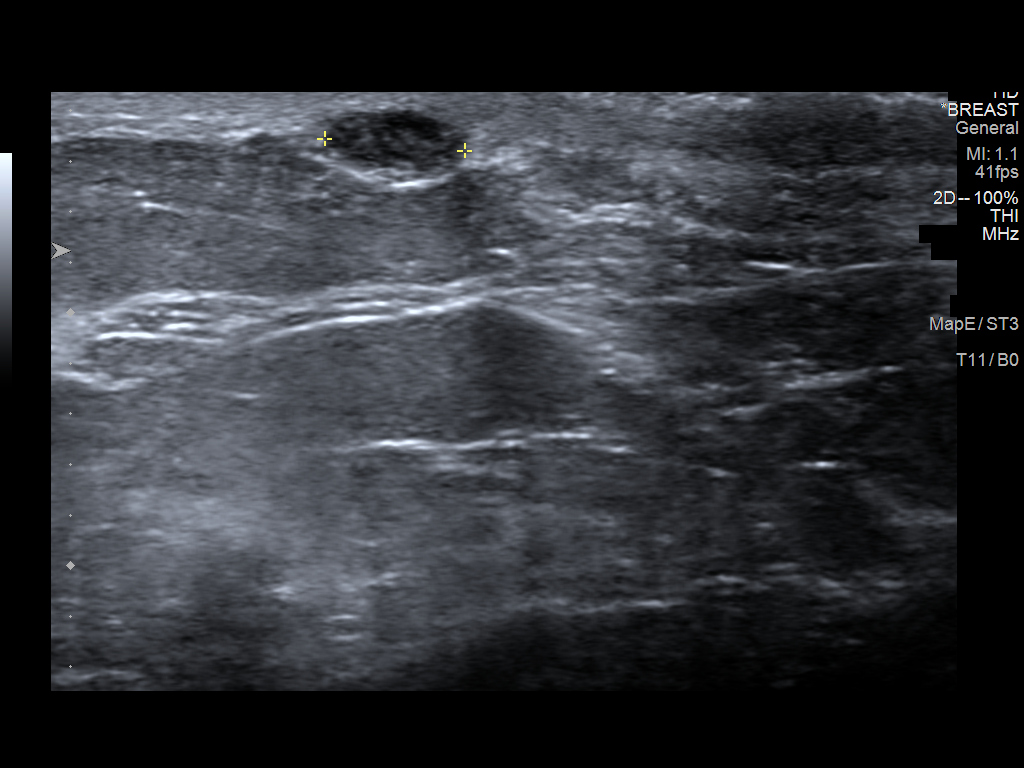
[im 3/7]
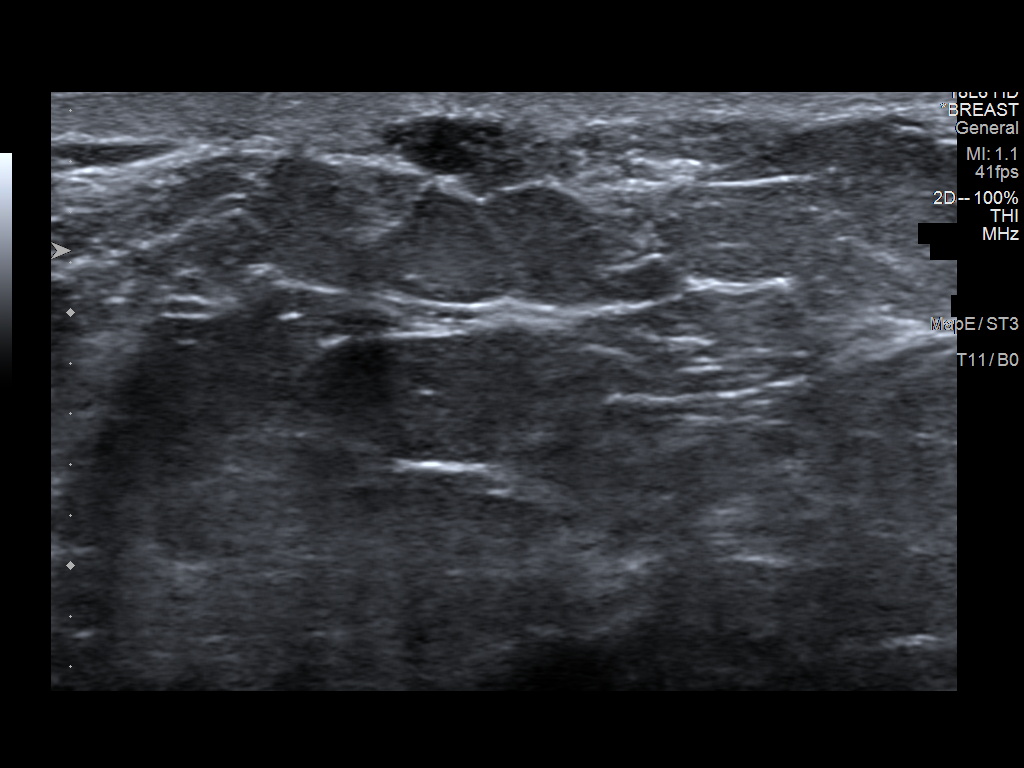
[im 4/7]
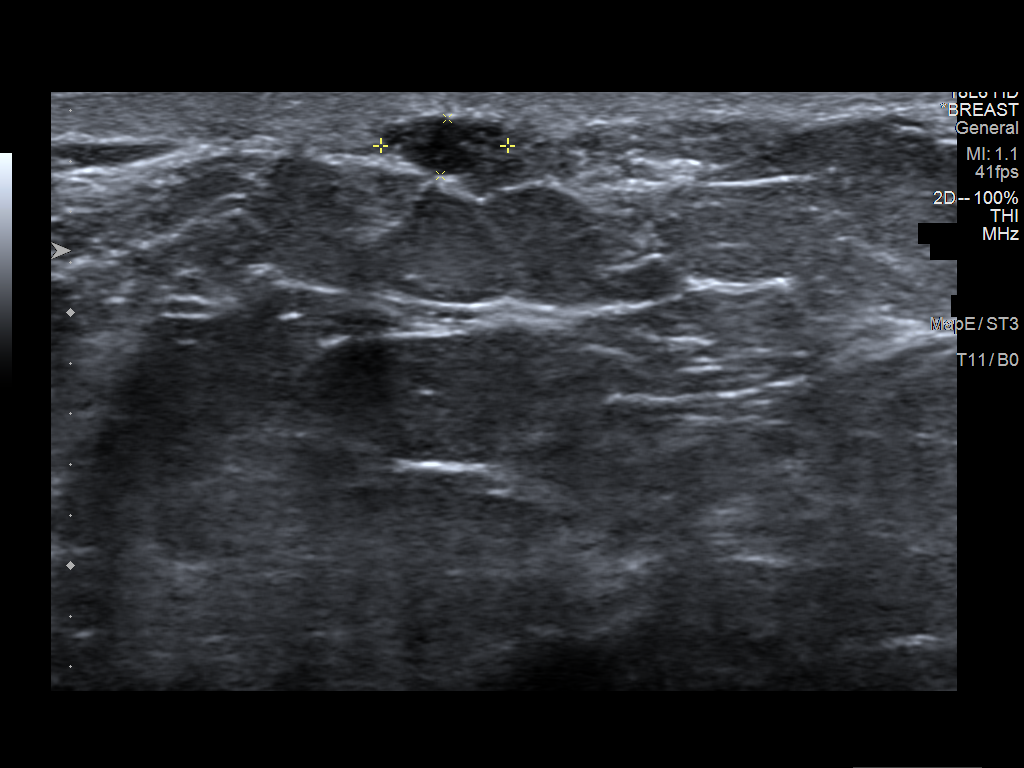
[im 5/7]
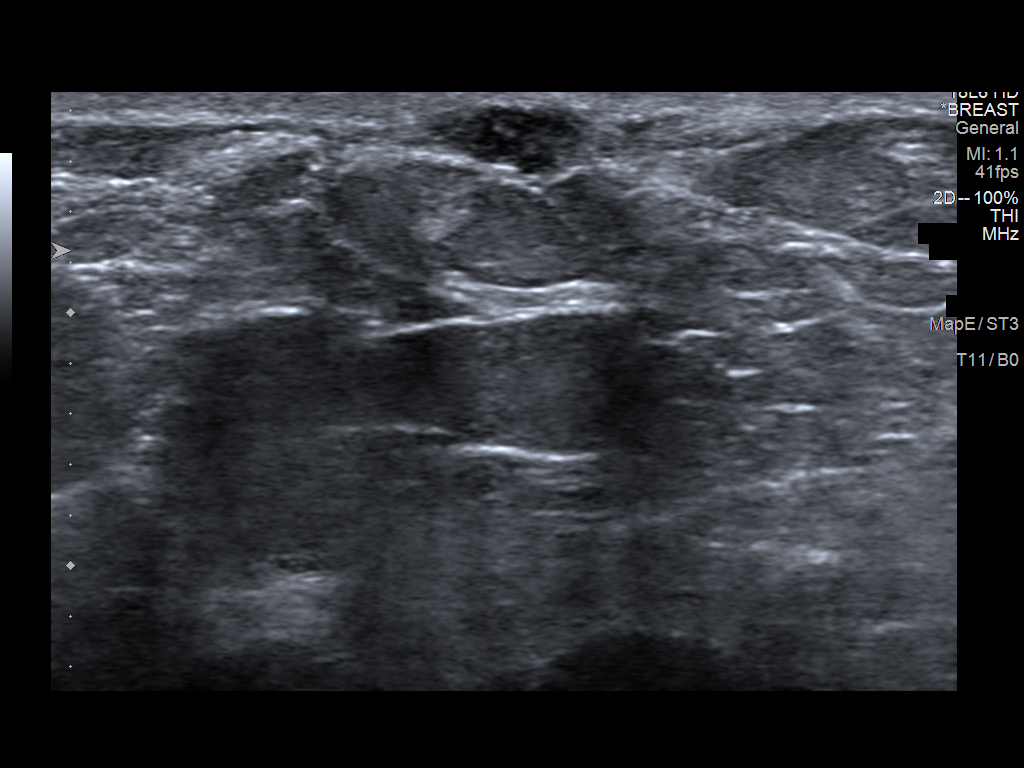
[im 6/7]
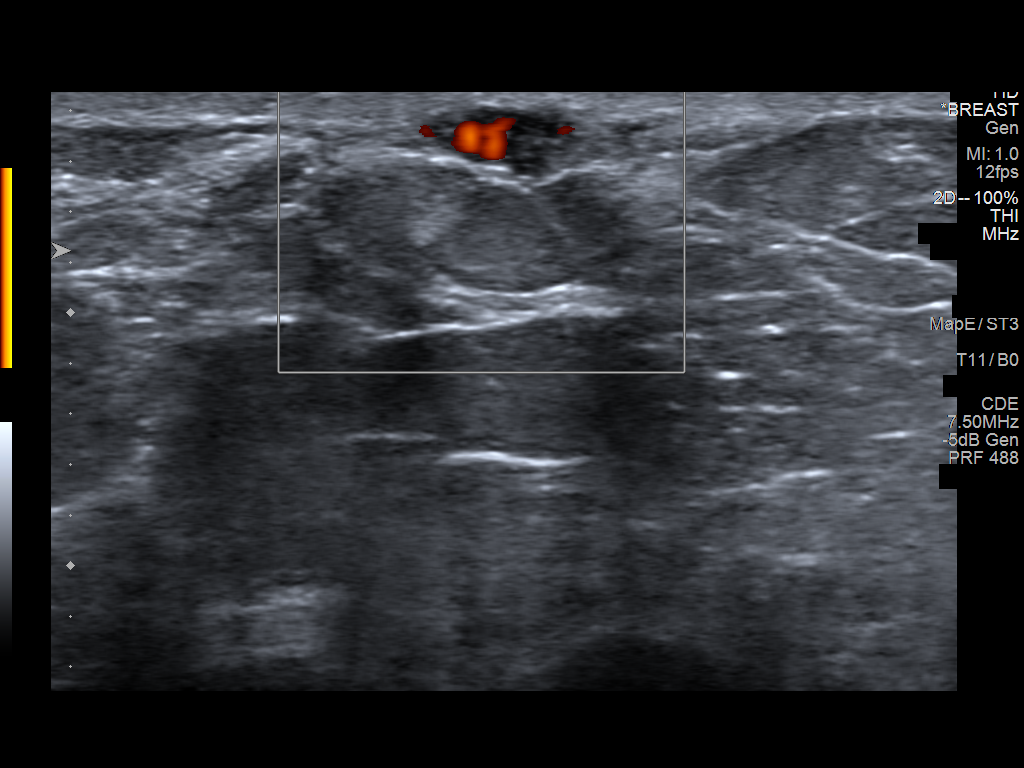
[im 7/7]
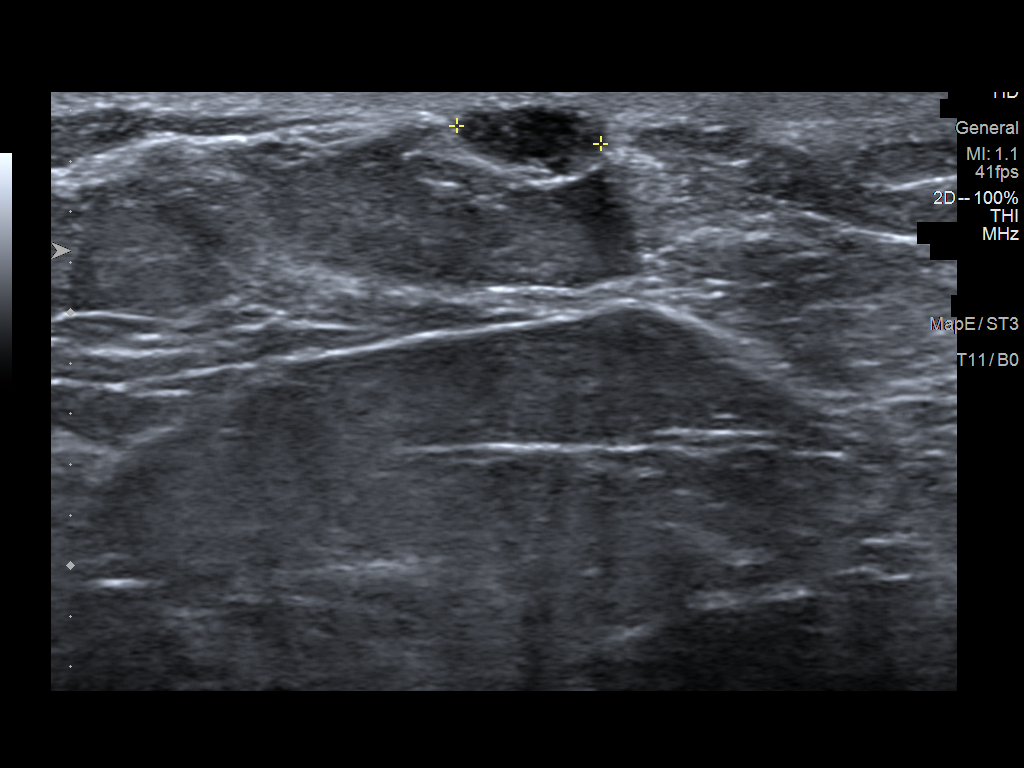

[7 of 7 positions shown; findings below may reference images not displayed]

ACR Breast Density Category b: There are scattered areas of
fibroglandular density.
FINDINGS: Mammogram: No suspicious mass, distortion, or microcalcifications
are identified to suggest presence of malignancy in the bilateral
breasts.

Mammographic images were processed with CAD.

Targeted ultrasound is performed, showing an oval hypoechoic mass
measuring 0.6 x 0.2 x 0.5 cm, previously 0.5 x 0.2 x 0.6 cm, located
within the skin at the 2 o'clock retroareolar region. There is a
probable pore visualized leading to the skin.
IMPRESSION: Right breast 6 mm mass at the 2 o'clock retroareolar region located
within the skin, stable for over a year. This is considered a benign
finding given location in the skin.

RECOMMENDATION:
Screening mammogram in one year.(Code:MM-O-J81)

I have discussed the findings and recommendations with the patient.
If applicable, a reminder letter will be sent to the patient
regarding the next appointment.

BI-RADS CATEGORY  2: Benign.

## 2023-04-16 ENCOUNTER — Ambulatory Visit: Payer: BLUE CROSS/BLUE SHIELD | Admitting: Family Medicine

## 2023-04-16 ENCOUNTER — Encounter: Payer: Self-pay | Admitting: Family Medicine

## 2023-04-16 VITALS — BP 120/78 | HR 87 | Ht 66.0 in | Wt 227.0 lb

## 2023-04-16 DIAGNOSIS — Z114 Encounter for screening for human immunodeficiency virus [HIV]: Secondary | ICD-10-CM

## 2023-04-16 DIAGNOSIS — E559 Vitamin D deficiency, unspecified: Secondary | ICD-10-CM | POA: Diagnosis not present

## 2023-04-16 DIAGNOSIS — Z1159 Encounter for screening for other viral diseases: Secondary | ICD-10-CM

## 2023-04-16 DIAGNOSIS — R600 Localized edema: Secondary | ICD-10-CM | POA: Diagnosis not present

## 2023-04-16 DIAGNOSIS — A6000 Herpesviral infection of urogenital system, unspecified: Secondary | ICD-10-CM | POA: Insufficient documentation

## 2023-04-16 DIAGNOSIS — M25471 Effusion, right ankle: Secondary | ICD-10-CM

## 2023-04-16 DIAGNOSIS — E538 Deficiency of other specified B group vitamins: Secondary | ICD-10-CM | POA: Diagnosis not present

## 2023-04-16 DIAGNOSIS — Z13220 Encounter for screening for lipoid disorders: Secondary | ICD-10-CM

## 2023-04-16 DIAGNOSIS — Z1329 Encounter for screening for other suspected endocrine disorder: Secondary | ICD-10-CM

## 2023-04-16 DIAGNOSIS — Z131 Encounter for screening for diabetes mellitus: Secondary | ICD-10-CM

## 2023-04-16 DIAGNOSIS — M25571 Pain in right ankle and joints of right foot: Secondary | ICD-10-CM | POA: Diagnosis not present

## 2023-04-16 DIAGNOSIS — M1990 Unspecified osteoarthritis, unspecified site: Secondary | ICD-10-CM | POA: Insufficient documentation

## 2023-04-16 DIAGNOSIS — Z23 Encounter for immunization: Secondary | ICD-10-CM | POA: Diagnosis not present

## 2023-04-16 MED ORDER — FUROSEMIDE 20 MG PO TABS: 20.0000 mg | ORAL_TABLET | Freq: Every day | ORAL | 2 refills | Status: AC | PRN

## 2023-04-16 MED ORDER — MOMETASONE FUROATE 0.1 % EX CREA: 1.0000 | TOPICAL_CREAM | Freq: Two times a day (BID) | CUTANEOUS | 3 refills | Status: DC

## 2023-04-16 MED ORDER — VALACYCLOVIR HCL 500 MG PO TABS: 500.0000 mg | ORAL_TABLET | Freq: Two times a day (BID) | ORAL | 3 refills | Status: AC

## 2023-04-16 MED ORDER — MONTELUKAST SODIUM 10 MG PO TABS: 10.0000 mg | ORAL_TABLET | Freq: Every day | ORAL | 3 refills | Status: AC

## 2023-04-16 MED ORDER — ALBUTEROL SULFATE HFA 108 (90 BASE) MCG/ACT IN AERS: 2.0000 | INHALATION_SPRAY | RESPIRATORY_TRACT | 3 refills | Status: AC | PRN

## 2023-04-16 NOTE — Assessment & Plan Note (Signed)
Refilled Lasix 20 MG PRN  Advise patient follow a low-salt diet, wear support stockings, maintain an exercise routine 3-5 times a week, keep legs on elevated position raise legs on pillow above your heart while lying down.

## 2023-04-16 NOTE — Assessment & Plan Note (Signed)
Xray ordered - awaiting results will follow up Advise icing, stretching and modifying activities that cause pain. Possible physical therapy referral, shoe inserts and NSAIDs for pain management. Discussed theThe RICE method: R: Rest the painful area for a few days. I: Ice the area for 20 minutes at a time to relieve inflammation. C: Compress the area with a soft wrap to reduce swelling. E: Elevate the area by putting the foot on a few pillows.   

## 2023-04-16 NOTE — Patient Instructions (Signed)

## 2023-04-16 NOTE — Progress Notes (Signed)
New Patient Office Visit   Subjective   Patient ID: Angie Powell, female    DOB: [birth year]  Age: 57 y.o. MRN: [id number]  CC:  Chief Complaint  Patient presents with   Establish Care    HPI Angie Powell 57 year old female, presents to establish care. She  has a past medical history of Arthritis, Asthma, Benign neoplasm of thyroid, Depression, Family history of diabetes mellitus, GERD (gastroesophageal reflux disease), Heartburn, Obesity, OSA (obstructive sleep apnea), Right foot pain, and Tibialis tendinitis.  Ankle Pain  The injury mechanism was a fall and a twisting injury. The pain is present in the right ankle. The pain is at a severity of 10/10. The pain has been Constant since onset. Pertinent negatives include no inability to bear weight, muscle weakness, numbness or tingling. She reports no foreign bodies present. The symptoms are aggravated by movement. She has tried NSAIDs and rest for the symptoms. The treatment provided moderate relief.      Outpatient Encounter Medications as of 04/16/2023  Medication Sig   azelastine (ASTELIN) 0.1 % nasal spray Place into the nose.   b complex vitamins tablet Take 1 tablet by mouth daily.   beclomethasone (QVAR) 40 MCG/ACT inhaler Inhale 2 puffs into the lungs 2 (two) times daily.   EPINEPHrine 0.3 mg/0.3 mL IJ SOAJ injection AS DIRECTED INJECTION 30 DAYS   ketoconazole (NIZORAL) 2 % cream Apply topically 2 (two) times daily as needed for irritation.   Loratadine 10 MG CAPS Take by mouth.   omeprazole-sodium bicarbonate (ZEGERID) 40-1100 MG capsule Take 1 capsule by mouth daily before breakfast.   sodium chloride (OCEAN) 0.65 % nasal spray Place into the nose.   triamcinolone (NASACORT) 55 MCG/ACT AERO nasal inhaler Place 2 sprays into the nose daily as needed.   vitamin C (ASCORBIC ACID) 500 MG tablet Take 500 mg by mouth daily.   Vitamin D, Ergocalciferol, (DRISDOL) 1.25 MG (50000 UNIT) CAPS capsule Take 1 capsule (50,000 Units  total) by mouth every 7 (seven) days.   [DISCONTINUED] albuterol (PROVENTIL HFA) 108 (90 BASE) MCG/ACT inhaler Inhale 2 puffs into the lungs every 4 (four) hours as needed for wheezing.   [DISCONTINUED] mometasone (ELOCON) 0.1 % cream Apply 1 application topically 2 (two) times daily.   [DISCONTINUED] montelukast (SINGULAIR) 10 MG tablet Take 10 mg by mouth at bedtime.     [DISCONTINUED] valACYclovir (VALTREX) 500 MG tablet take 1 tablet by mouth twice a day   albuterol (PROVENTIL HFA) 108 (90 Base) MCG/ACT inhaler Inhale 2 puffs into the lungs every 4 (four) hours as needed for wheezing.   azithromycin (ZITHROMAX) 250 MG tablet Take 2 tablets x 1 day, then 1 tablet daily for 4 days for sinus infection   furosemide (LASIX) 20 MG tablet Take 1 tablet (20 mg total) by mouth daily as needed.   ibuprofen (ADVIL) 600 MG tablet Take 1 tablet (600 mg total) by mouth every 8 (eight) hours as needed.   mometasone (ELOCON) 0.1 % cream Apply 1 Application topically 2 (two) times daily.   montelukast (SINGULAIR) 10 MG tablet Take 1 tablet (10 mg total) by mouth at bedtime.   Pseudoephedrine-APAP 30-325 MG TABS Take by mouth.   terbinafine (LAMISIL) 250 MG tablet Take 1 tablet (250 mg total) by mouth daily.   valACYclovir (VALTREX) 500 MG tablet Take 1 tablet (500 mg total) by mouth 2 (two) times daily.   [DISCONTINUED] furosemide (LASIX) 20 MG tablet Take 1 tablet (20 mg total) by  mouth daily as needed. (Patient not taking: Reported on 04/16/2023)   No facility-administered encounter medications on file as of 04/16/2023.    Past Surgical History:  Procedure Laterality Date   APPENDECTOMY     right thyroidectomy, benign disease  06/2008   TONSILLECTOMY     TUBAL LIGATION      Review of Systems  Constitutional:  Negative for chills and fever.  Respiratory:  Negative for shortness of breath.   Cardiovascular:  Negative for chest pain.  Gastrointestinal:  Negative for abdominal pain.  Musculoskeletal:   Positive for falls, joint pain and myalgias.  Neurological:  Negative for dizziness, tingling, numbness and headaches.      Objective    BP 120/78   Pulse 87   Ht 5\' 6"  (1.676 m)   Wt 227 lb (103 kg)   SpO2 94%   BMI 36.64 kg/m   Physical Exam Vitals reviewed.  Constitutional:      General: She is not in acute distress.    Appearance: Normal appearance. She is not ill-appearing, toxic-appearing or diaphoretic.  HENT:     Head: Normocephalic.  Eyes:     General:        Right eye: No discharge.        Left eye: No discharge.     Conjunctiva/sclera: Conjunctivae normal.  Cardiovascular:     Rate and Rhythm: Normal rate.     Pulses: Normal pulses.     Heart sounds: Normal heart sounds.  Pulmonary:     Effort: Pulmonary effort is normal. No respiratory distress.     Breath sounds: Normal breath sounds.  Abdominal:     General: Bowel sounds are normal.     Palpations: Abdomen is soft.     Tenderness: There is no abdominal tenderness. There is no right CVA tenderness, left CVA tenderness or guarding.  Musculoskeletal:        General: Normal range of motion.     Cervical back: Normal range of motion.     Right lower leg: Edema present.     Left lower leg: Edema present.  Skin:    General: Skin is warm and dry.     Capillary Refill: Capillary refill takes less than 2 seconds.  Neurological:     General: No focal deficit present.     Mental Status: She is alert and oriented to person, place, and time.     Coordination: Coordination normal.     Gait: Gait normal.  Psychiatric:        Mood and Affect: Mood normal.        Behavior: Behavior normal.       Assessment & Plan:  Pain and swelling of right ankle -     DG Ankle Complete Right; Future  Screening for lipid disorders -     CBC with Differential/Platelet -     CMP14+EGFR -     Lipid panel  Screening for diabetes mellitus -     Hemoglobin A1c -     Microalbumin / creatinine urine ratio  Screening for  thyroid disorder -     TSH + free T4  Need for hepatitis C screening test -     Hepatitis C antibody  Screening for HIV (human immunodeficiency virus) -     HIV Antibody (routine testing w rflx)  Vitamin D deficiency -     VITAMIN D 25 Hydroxy (Vit-D Deficiency, Fractures)  Vitamin B12 deficiency -     Vitamin B12  Peripheral  edema Assessment & Plan: Refilled Lasix 20 MG PRN  Advise patient follow a low-salt diet, wear support stockings, maintain an exercise routine 3-5 times a week, keep legs on elevated position raise legs on pillow above your heart while lying down.    Right ankle pain, unspecified chronicity Assessment & Plan: Xray ordered - awaiting results will follow up Advise icing, stretching and modifying activities that cause pain. Possible physical therapy referral, shoe inserts and NSAIDs for pain management. Discussed theThe RICE method: R: Rest the painful area for a few days. I: Ice the area for 20 minutes at a time to relieve inflammation. C: Compress the area with a soft wrap to reduce swelling. E: Elevate the area by putting the foot on a few pillows.    Other orders -     Varicella-zoster vaccine IM -     Albuterol Sulfate HFA; Inhale 2 puffs into the lungs every 4 (four) hours as needed for wheezing.  Dispense: 1 each; Refill: 3 -     Mometasone Furoate; Apply 1 Application topically 2 (two) times daily.  Dispense: 50 g; Refill: 3 -     Montelukast Sodium; Take 1 tablet (10 mg total) by mouth at bedtime.  Dispense: 30 tablet; Refill: 3 -     valACYclovir HCl; Take 1 tablet (500 mg total) by mouth 2 (two) times daily.  Dispense: 14 tablet; Refill: 3 -     Furosemide; Take 1 tablet (20 mg total) by mouth daily as needed.  Dispense: 90 tablet; Refill: 2    Return in about 6 months (around 10/16/2023), or if symptoms worsen or fail to improve, for routine labs, chronic follow-up.   Cruzita Lederer Newman Nip, FNP

## 2023-04-24 LAB — VITAMIN B12: Vitamin B-12: 1348 pg/mL — ABNORMAL HIGH (ref 232–1245)

## 2023-04-24 LAB — MICROALBUMIN / CREATININE URINE RATIO

## 2023-04-24 LAB — LIPID PANEL
LDL Chol Calc (NIH): 105 mg/dL — ABNORMAL HIGH (ref 0–99)
VLDL Cholesterol Cal: 17 mg/dL (ref 5–40)

## 2023-04-24 LAB — TSH+FREE T4: Free T4: 1.12 ng/dL (ref 0.82–1.77)

## 2023-04-24 LAB — HEMOGLOBIN A1C: Hgb A1c MFr Bld: 5.4 % (ref 4.8–5.6)

## 2023-04-24 NOTE — Progress Notes (Signed)
Please inform patient  Cholesterol levels elevated, start lifestyle modifications follow diet low in saturated fat, reduce dietary salt intake, avoid fatty foods, maintain an exercise routine 3 to 5 days a week for a minimum total of 150 minutes.   Vitamin D levels low, I advise to taking  over the counter supplements of vitamin D [phone] IU/day to prevent low vitamin D levels. Consuming Vitamin D rich food sources include fish, salmon, sardines, egg yolks, red meat, liver, oranges, soy milk.

## 2023-04-25 LAB — CMP14+EGFR
ALT: 21 IU/L (ref 0–32)
AST: 19 IU/L (ref 0–40)
Albumin/Globulin Ratio: 1.5
Albumin: 4.2 g/dL (ref 3.8–4.9)
Alkaline Phosphatase: 86 IU/L (ref 44–121)
BUN/Creatinine Ratio: 15 (ref 9–23)
BUN: 14 mg/dL (ref 6–24)
Bilirubin Total: 0.4 mg/dL (ref 0.0–1.2)
CO2: 24 mmol/L (ref 20–29)
Calcium: 9.6 mg/dL (ref 8.7–10.2)
Chloride: 102 mmol/L (ref 96–106)
Creatinine, Ser: 0.93 mg/dL (ref 0.57–1.00)
Globulin, Total: 2.8 g/dL (ref 1.5–4.5)
Glucose: 96 mg/dL (ref 70–99)
Potassium: 4.6 mmol/L (ref 3.5–5.2)
Sodium: 140 mmol/L (ref 134–144)
Total Protein: 7 g/dL (ref 6.0–8.5)
eGFR: 71 mL/min/{1.73_m2} (ref >59)

## 2023-04-25 LAB — CBC WITH DIFFERENTIAL/PLATELET
Basophils Absolute: 0.1 10*3/uL (ref 0.0–0.2)
Basos: 1 %
EOS (ABSOLUTE): 0.1 10*3/uL (ref 0.0–0.4)
Eos: 2 %
Hematocrit: 41 % (ref 34.0–46.6)
Hemoglobin: 13.3 g/dL (ref 11.1–15.9)
Immature Grans (Abs): 0 10*3/uL (ref 0.0–0.1)
Immature Granulocytes: 0 %
Lymphocytes Absolute: 2.5 10*3/uL (ref 0.7–3.1)
Lymphs: 42 %
MCH: 28.9 pg (ref 26.6–33.0)
MCHC: 32.4 g/dL (ref 31.5–35.7)
MCV: 89 fL (ref 79–97)
Monocytes Absolute: 0.4 10*3/uL (ref 0.1–0.9)
Monocytes: 6 %
Neutrophils Absolute: 3 10*3/uL (ref 1.4–7.0)
Neutrophils: 49 %
Platelets: 306 10*3/uL (ref 150–450)
RBC: 4.6 x10E6/uL (ref 3.77–5.28)
RDW: 12 % (ref 11.7–15.4)
WBC: 6.1 10*3/uL (ref 3.4–10.8)

## 2023-04-25 LAB — VITAMIN D 25 HYDROXY (VIT D DEFICIENCY, FRACTURES): Vit D, 25-Hydroxy: 23.5 ng/mL — ABNORMAL LOW (ref 30.0–100.0)

## 2023-04-25 LAB — LIPID PANEL
Chol/HDL Ratio: 3.5 ratio (ref 0.0–4.4)
Cholesterol, Total: 171 mg/dL (ref 100–199)
HDL: 49 mg/dL (ref >39)
Triglycerides: 94 mg/dL (ref 0–149)

## 2023-04-25 LAB — TSH+FREE T4: TSH: 1.1 u[IU]/mL (ref 0.450–4.500)

## 2023-04-25 LAB — HIV ANTIBODY (ROUTINE TESTING W REFLEX): HIV Screen 4th Generation wRfx: NONREACTIVE

## 2023-04-25 LAB — MICROALBUMIN / CREATININE URINE RATIO
Microalb/Creat Ratio: 6 mg/g creat (ref 0–29)
Microalbumin, Urine: 12.4 ug/mL

## 2023-04-25 LAB — HEPATITIS C ANTIBODY: Hep C Virus Ab: NONREACTIVE

## 2023-04-25 LAB — HEMOGLOBIN A1C: Est. average glucose Bld gHb Est-mCnc: 108 mg/dL

## 2023-10-15 NOTE — Patient Instructions (Signed)

## 2023-10-15 NOTE — Progress Notes (Unsigned)
   Established Patient Office Visit   Subjective  Patient ID: Angie Powell, female    DOB: 1965-12-15  Age: 57 y.o. MRN: [id number]  No chief complaint on file.   She  has a past medical history of Arthritis, Asthma, Benign neoplasm of thyroid, Depression, Family history of diabetes mellitus, GERD (gastroesophageal reflux disease), Heartburn, Obesity, OSA (obstructive sleep apnea), Right foot pain, and Tibialis tendinitis.  HPI  ROS    Objective:     There were no vitals taken for this visit. BP Readings from Last 3 Encounters:  04/16/23 120/78  01/24/20 120/80  04/06/19 100/62      Physical Exam   No results found for any visits on 10/16/23.  The 10-year ASCVD risk score (Arnett DK, et al., 2019) is: 2.8%    Assessment & Plan:  There are no diagnoses linked to this encounter.  No follow-ups on file.   Angie Lederer Newman Nip, FNP

## 2023-10-16 ENCOUNTER — Ambulatory Visit: Payer: BLUE CROSS/BLUE SHIELD | Admitting: Family Medicine

## 2024-02-01 ENCOUNTER — Ambulatory Visit
Admission: EM | Admit: 2024-02-01 | Discharge: 2024-02-01 | Disposition: A | Discharge status: Home / Self Care | Attending: Family Medicine | Admitting: Family Medicine

## 2024-02-01 DIAGNOSIS — E860 Dehydration: Secondary | ICD-10-CM | POA: Diagnosis not present

## 2024-02-01 DIAGNOSIS — U071 COVID-19: Secondary | ICD-10-CM

## 2024-02-01 DIAGNOSIS — R000 Tachycardia, unspecified: Secondary | ICD-10-CM

## 2024-02-01 LAB — POC COVID19/FLU A&B COMBO
Covid Antigen, POC: POSITIVE — AB
Influenza A Antigen, POC: NEGATIVE
Influenza B Antigen, POC: NEGATIVE

## 2024-02-01 MED ORDER — ONDANSETRON HCL 4 MG PO TABS: 4.0000 mg | ORAL_TABLET | Freq: Four times a day (QID) | ORAL | 0 refills | Status: AC

## 2024-02-01 MED ORDER — SODIUM CHLORIDE 0.9 % IV BOLUS
1000.0000 mL | Freq: Once | INTRAVENOUS | Status: AC
  Administered 2024-02-01: 1000 mL via INTRAVENOUS

## 2024-02-01 MED ORDER — PAXLOVID (300/100) 20 X 150 MG & 10 X 100MG PO TBPK: 3.0000 | ORAL_TABLET | Freq: Two times a day (BID) | ORAL | 0 refills | Status: AC

## 2024-02-01 MED ORDER — ACETAMINOPHEN 325 MG PO TABS: 650.0000 mg | ORAL_TABLET | Freq: Once | ORAL | Status: DC

## 2024-02-01 MED ORDER — ONDANSETRON HCL 4 MG/2ML IJ SOLN
4.0000 mg | Freq: Once | INTRAMUSCULAR | Status: AC
  Administered 2024-02-01: 4 mg via INTRAMUSCULAR

## 2024-02-01 NOTE — ED Notes (Signed)
Pt declined tylenol

## 2024-02-01 NOTE — ED Triage Notes (Signed)
 Pt present with c/o chills, headaches and generalized body aches X 24 hours  States she has been vomiting since this morning. Pt has been trying to stay hydrated. Unable to keep any foods down.   Home interventions: ibuprofen

## 2024-02-01 NOTE — ED Provider Notes (Signed)
 [facility]    CSN: [id number] Arrival date & time: 02/01/24  1322      History   Chief Complaint Chief Complaint  Patient presents with   Chills    HPI Angie Powell is a 58 y.o. female.   HPI Patient with a history of asthma, arthritis, GERD, here today with persistent nausea, vomiting, fever, generalized bodyaches and feeling generalized weakness x 1 day.  On arrival patient had temperature of 102.1 reports she has been minimally able to tolerate any fluids and unable to tolerate any food without vomiting. Patient's heart rate on arrival is averaging between 121/125.  Patient was offered Tylenol and declined reported that she would take her ibuprofen which she took while in the room Past Medical History:  Diagnosis Date   Arthritis    Asthma    Benign neoplasm of thyroid    Depression    Family history of diabetes mellitus    GERD (gastroesophageal reflux disease)    Heartburn    Obesity    OSA (obstructive sleep apnea)    Right foot pain    Tibialis tendinitis     Patient Active Problem List   Diagnosis Date Noted   Genital herpes simplex 04/16/2023   Osteoarthritis 04/16/2023   Right ankle pain 04/16/2023   Primary osteoarthritis of left knee 03/09/2020   Migraine 08/05/2019   Degenerative tear of posterior horn of medial meniscus of left knee 05/11/2019   Left tibialis posterior tendinitis 04/06/2019   Allergy 11/23/2018   Peripheral edema 07/11/2017   Non-seasonal allergic rhinitis due to pollen 06/23/2017   Obstructive sleep apnea on CPAP 06/23/2017   New onset of headaches after age 1 08/17/2015   Blurry vision 08/17/2015   Morning headache 08/17/2015   Snoring 08/17/2015   MDD (major depressive disorder), recurrent episode (HCC) 06/27/2013   GAD (generalized anxiety disorder) 06/27/2013   Vitamin D deficiency 11/24/2012   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      Home Medications    Prior to Admission medications   Medication Sig Start Date End Date Taking? Authorizing Provider  albuterol (PROVENTIL HFA) 108 (90 Base) MCG/ACT inhaler Inhale 2 puffs into the lungs every 4 (four) hours as needed for wheezing. 04/16/23   Del Nigel Berthold, FNP  azelastine (ASTELIN) 0.1 % nasal spray Place into the nose.    [provider]  azithromycin (ZITHROMAX) 250 MG tablet Take 2 tablets x 1 day, then 1 tablet daily for 4 days for sinus infection 01/24/20   Salley Scarlet, MD  b complex vitamins tablet Take 1 tablet by mouth daily.    [provider]  beclomethasone (QVAR) 40 MCG/ACT inhaler Inhale 2 puffs into the lungs 2 (two) times daily.    [provider]  EPINEPHrine 0.3 mg/0.3 mL IJ SOAJ injection AS DIRECTED INJECTION 30 DAYS 12/07/17   [provider]  furosemide (LASIX) 20 MG tablet Take 1 tablet (20 mg total) by mouth daily as needed. 04/16/23   Del Nigel Berthold, FNP  ibuprofen (ADVIL) 600 MG tablet Take 1  tablet (600 mg total) by mouth every 8 (eight) hours as needed. 04/06/19   Dierks, Velna Hatchet, MD  ketoconazole (NIZORAL) 2 % cream Apply topically 2 (two) times daily as needed for irritation. 12/23/19   Salley Scarlet, MD  Loratadine 10 MG CAPS Take by mouth.    [provider]  mometasone (ELOCON) 0.1 % cream Apply 1 Application topically 2 (two) times daily. 04/16/23   Del Nigel Berthold, FNP  montelukast (SINGULAIR) 10 MG tablet Take 1 tablet (10 mg total) by mouth at bedtime. 04/16/23   Del Nigel Berthold, FNP  omeprazole-sodium bicarbonate (ZEGERID) 40-1100 MG capsule Take 1 capsule by mouth daily before  breakfast. 11/09/18   [place], Velna Hatchet, MD  Pseudoephedrine-APAP 30-325 MG TABS Take by mouth.    [provider]  sodium chloride (OCEAN) 0.65 % nasal spray Place into the nose.    [provider]  terbinafine (LAMISIL) 250 MG tablet Take 1 tablet (250 mg total) by mouth daily. 01/24/20   Salley Scarlet, MD  triamcinolone (NASACORT) 55 MCG/ACT AERO nasal inhaler Place 2 sprays into the nose daily as needed.    [provider]  valACYclovir (VALTREX) 500 MG tablet Take 1 tablet (500 mg total) by mouth 2 (two) times daily. 04/16/23   Del Nigel Berthold, FNP  vitamin C (ASCORBIC ACID) 500 MG tablet Take 500 mg by mouth daily.    [provider]  Vitamin D, Ergocalciferol, (DRISDOL) 1.25 MG (50000 UNIT) CAPS capsule Take 1 capsule (50,000 Units total) by mouth every 7 (seven) days. 01/26/20   Salley Scarlet, MD    Family History Family History  Problem Relation Age of Onset   Cancer Mother        parathyroid    Diabetes Mother    Hyperlipidemia Mother    Hypertension Mother    Heart disease Maternal Grandmother    Alcohol abuse Neg Hx    Drug abuse Neg Hx    Bipolar disorder Neg Hx    Anxiety disorder Neg Hx    Dementia Neg Hx    Depression Neg Hx    OCD Neg Hx    Paranoid behavior Neg Hx    Schizophrenia Neg Hx    Seizures Neg Hx    Sexual abuse Neg Hx    Physical abuse Neg Hx    Migraines Neg Hx     Social History Social History   Tobacco Use   Smoking status: Never   Smokeless tobacco: Never  Substance Use Topics   Alcohol use: No    Alcohol/week: 0.0 standard drinks of alcohol   Drug use: No     Allergies   Patient has no known allergies.   Review of Systems Review of Systems   Physical Exam Triage Vital Signs ED Triage Vitals  Encounter Vitals Group     BP 02/01/24 1338 107/68     Systolic BP Percentile --      Diastolic BP Percentile --      Pulse Rate 02/01/24 1338 (!) 121     Resp 02/01/24 1338 15      Temp 02/01/24 1338 (!) 102.1 F (38.9 C)     Temp Source 02/01/24 1338 Oral     SpO2 02/01/24 1338 96 %     Weight --      Height --      Head Circumference --      Peak Flow --      Pain Score  02/01/24 1337 7     Pain Loc --      Pain Education --      Exclude from Growth Chart --    No data found.  Updated Vital Signs BP 107/68 (BP Location: Left Arm)   Pulse (!) 118   Temp (!) 102.1 F (38.9 C) (Oral)   Resp 15   LMP 08/01/2016 (Approximate)   SpO2 96%   Visual Acuity Right Eye Distance:   Left Eye Distance:   Bilateral Distance:    Right Eye Near:   Left Eye Near:    Bilateral Near:     Physical Exam Constitutional:      Appearance: She is well-developed. She is ill-appearing.  HENT:     Head: Normocephalic and atraumatic.     Right Ear: Tympanic membrane and ear canal normal.     Left Ear: Tympanic membrane and ear canal normal.     Nose: Congestion present.     Mouth/Throat:     Mouth: No oral lesions.     Pharynx: Uvula midline. Pharyngeal swelling and uvula swelling present.     Tonsils: No tonsillar exudate or tonsillar abscesses. 0 on the right. 0 on the left.  Eyes:     Conjunctiva/sclera: Conjunctivae normal.     Pupils: Pupils are equal, round, and reactive to light.  Cardiovascular:     Rate and Rhythm: Regular rhythm. Tachycardia present.  Pulmonary:     Effort: Pulmonary effort is normal.     Breath sounds: Normal breath sounds.  Musculoskeletal:     Cervical back: Normal range of motion and neck supple.  Skin:    General: Skin is warm and dry.     Capillary Refill: Capillary refill takes less than 2 seconds.  Neurological:     General: No focal deficit present.     Mental Status: She is alert and oriented to person, place, and time.  Psychiatric:        Mood and Affect: Mood normal.        Behavior: Behavior normal.      UC Treatments / Results  Labs (all labs ordered are listed, but only abnormal results are displayed) Labs  Reviewed  POC COVID19/FLU A&B COMBO - Abnormal; Notable for the following components:      Result Value   Covid Antigen, POC Positive (*)    All other components within normal limits    EKG   Radiology No results found.  Procedures Procedures (including critical care time)  Medications Ordered in UC Medications  acetaminophen (TYLENOL) tablet 650 mg (650 mg Oral Patient Refused/Not Given 02/01/24 1345)  ondansetron (ZOFRAN) injection 4 mg (4 mg Intramuscular Given 02/01/24 1349)  sodium chloride 0.9 % bolus 1,000 mL (1,000 mLs Intravenous New Bag/Given 02/01/24 1428)    Initial Impression / Assessment and Plan / UC Course  I have reviewed the triage vital signs and the nursing notes.  Pertinent labs & imaging results that were available during my care of the patient were reviewed by me and considered in my medical decision making (see chart for details).     Final Clinical Impressions(s) / UC Diagnoses   Final diagnoses:  COVID-19 virus infection  Dehydration  Tachycardia   Discharge Instructions   None    ED Prescriptions   None    PDMP not reviewed this encounter.

## 2024-02-01 NOTE — Discharge Instructions (Signed)
 If needed your next dose of Zofran can be taken at 7:00 pm. Start Paxlovid today.  Continue ibuprofen or Tylenol as needed for management of fever. Force fluids.  Experience any difficulty breathing, severe chest pain or feelings of lethargy go immediately to the emergency department.

## 2024-09-02 ENCOUNTER — Ambulatory Visit (INDEPENDENT_AMBULATORY_CARE_PROVIDER_SITE_OTHER): Admitting: Family Medicine

## 2024-09-02 ENCOUNTER — Encounter: Payer: Self-pay | Admitting: Family Medicine

## 2024-09-02 ENCOUNTER — Ambulatory Visit (HOSPITAL_COMMUNITY)
Admission: RE | Admit: 2024-09-02 | Discharge: 2024-09-02 | Disposition: A | Discharge status: Home / Self Care | Source: Ambulatory Visit | Attending: Family Medicine | Admitting: Family Medicine

## 2024-09-02 ENCOUNTER — Ambulatory Visit: Payer: Self-pay | Admitting: Family Medicine

## 2024-09-02 VITALS — BP 143/80 | HR 85 | Ht 66.0 in | Wt 157.0 lb

## 2024-09-02 DIAGNOSIS — H9312 Tinnitus, left ear: Secondary | ICD-10-CM | POA: Diagnosis not present

## 2024-09-02 DIAGNOSIS — G8929 Other chronic pain: Secondary | ICD-10-CM | POA: Insufficient documentation

## 2024-09-02 DIAGNOSIS — M25572 Pain in left ankle and joints of left foot: Secondary | ICD-10-CM | POA: Insufficient documentation

## 2024-09-02 NOTE — Patient Instructions (Addendum)
 I appreciate the opportunity to provide care to you today!   Labs: please stop by the lab today to get your blood drawn (CBC, BMP, B12)  Left Ankle Pain and Swelling -Please stop by [facility] to get a X-ray of the left ankle to assess for degenerative changes, soft tissue injury, or other abnormalities. -Recommend ibuprofen  600 mg every 6 hours as needed for pain and inflammation (take with food). -You may also use acetaminophen  [phone] mg every 6 hours as needed (do not exceed 4,000 mg/day).  Nonpharmacological Interventions: -Apply ice for 15-20 minutes several times a day to reduce swelling. -Elevate the ankle above heart level when resting. -Avoid prolonged standing or activities that aggravate pain. -Use ankle support or compression wrap for stability and comfort. -Encourage gentle range-of-motion exercises as tolerated to maintain mobility.  Follow-Up: -Follow up in 2-4 weeks or sooner if pain or swelling worsens. -If imaging confirms arthritis or soft tissue injury, will  consider referral to orthopedics or physical therapy for further management. -Monitor for increased swelling, redness, warmth, or fever, which may indicate infection or worsening inflammation. -Maintain a balanced diet and stay hydrated to support healing.  Tinnitus of the Left Ear -Please follow up with ENT for further evaluation and management. -Will review your lab results once available. Please monitor for any of the following symptoms and report if they occur: -Worsening or persistent ringing in the ear -Hearing loss or changes in hearing -Dizziness or balance problems -Ear pain, drainage, or pressure   Please follow up if your symptoms worsen or fail to improve.    Please continue to a heart-healthy diet and increase your physical activities. Try to exercise for at least five days a week.    It was a pleasure to see you and I look forward to continuing to work together on your  health and well-being. Please do not hesitate to call the office if you need care or have questions about your care.  In case of emergency, please visit the Emergency Department for urgent care, or contact our clinic at [phone] to schedule an appointment. We're here to help you!   Have a wonderful day and week. With Gratitude, Meade JENEANE Gerlach MSN, FNP-BC, PMHNP-BC

## 2024-09-02 NOTE — Assessment & Plan Note (Signed)
 Left Ankle Pain and Swelling -Encouraged to please stop by [facility] to get a X-ray of the left ankle to assess for degenerative changes, soft tissue injury, or other abnormalities. -Recommend ibuprofen  600 mg every 6 hours as needed for pain and inflammation (take with food). -You may also use acetaminophen  [phone] mg every 6 hours as needed (do not exceed 4,000 mg/day).  Nonpharmacological Interventions: -Apply ice for 15-20 minutes several times a day to reduce swelling. -Elevate the ankle above heart level when resting. -Avoid prolonged standing or activities that aggravate pain. -Use ankle support or compression wrap for stability and comfort. -Encourage gentle range-of-motion exercises as tolerated to maintain mobility.  Follow-Up: -Follow up in 2-4 weeks or sooner if pain or swelling worsens. -If imaging confirms arthritis or soft tissue injury, will  consider referral to orthopedics or physical therapy for further management. -Monitor for increased swelling, redness, warmth, or fever, which may indicate infection or worsening inflammation. -Maintain a balanced diet and stay hydrated to support healing.

## 2024-09-02 NOTE — Progress Notes (Signed)
 Please inform the patient: Your imaging study shows that the ankle is swollen, but the bones are properly aligned and not broken. The swelling is likely due to fluid buildup. Please adhere to the recommendations discussed and follow up if symptoms worsen or fail to improve.

## 2024-09-02 NOTE — Assessment & Plan Note (Signed)
 The patient reports experiencing a knocking sound in the left ear, occurring off and on for approximately two months. She notes that prior to the onset of these symptoms, she had episodes of dizziness and imbalance. She denies hearing loss, ear drainage, or pain. -Examination of the ear was unremarkable. The patient is currently under the care of ENT in [facility] and is encouraged to follow up for further evaluation and management.  Patient Education: -Monitor for the following and report if symptoms occur: -Worsening or persistent ringing or knocking in the ear -Hearing loss or changes in hearing -Dizziness or balance problems -Ear pain, drainage, or pressure

## 2024-09-02 NOTE — Progress Notes (Signed)
 Acute Office Visit  Subjective:    Patient ID: Angie Powell, female    DOB: 08-26-1966, 58 y.o.   MRN: [id number]  Chief Complaint  Patient presents with   Ankle Pain    Left ankle pain     HPI The patient presents with complaints of left ankle swelling and pain for the past three weeks. She denies any recent injury or trauma. She has a history of arthritis in the right ankle diagnosed a few years ago. The pain is localized around an old scar from an injury sustained at age 50 or 69 and is described as intermittent and tender to touch, rated 7/10. She has not taken any medications for relief and is unsure of aggravating or relieving factors. She reports no numbness, tingling, fever, or chills and is able to bear weight. She also has a known history of a meniscus tear in the left knee.   Past Medical History:  Diagnosis Date   Arthritis    Asthma    Benign neoplasm of thyroid     Depression    Family history of diabetes mellitus    GERD (gastroesophageal reflux disease)    Heartburn    Obesity    OSA (obstructive sleep apnea)    Right foot pain    Tibialis tendinitis     Past Surgical History:  Procedure Laterality Date   APPENDECTOMY     right thyroidectomy, benign disease  06/2008   TONSILLECTOMY     TUBAL LIGATION      Family History  Problem Relation Age of Onset   Cancer Mother        parathyroid    Diabetes Mother    Hyperlipidemia Mother    Hypertension Mother    Heart disease Maternal Grandmother    Alcohol abuse Neg Hx    Drug abuse Neg Hx    Bipolar disorder Neg Hx    Anxiety disorder Neg Hx    Dementia Neg Hx    Depression Neg Hx    OCD Neg Hx    Paranoid behavior Neg Hx    Schizophrenia Neg Hx    Seizures Neg Hx    Sexual abuse Neg Hx    Physical abuse Neg Hx    Migraines Neg Hx     Social History   Socioeconomic History   Marital status: Single    Spouse name: Not on file   Number of children: 1   Years of education: 16   Highest  education level: Not on file  Occupational History   Occupation: Haematologist  Tobacco Use   Smoking status: Never   Smokeless tobacco: Never  Substance and Sexual Activity   Alcohol use: No    Alcohol/week: 0.0 standard drinks of alcohol   Drug use: No   Sexual activity: Yes  Other Topics Concern   Not on file  Social History Narrative   Lives at home with daughter.   Caffeine use:  4 cup daily   Social Drivers of Corporate investment banker Strain: Not on file  Food Insecurity: Not on file  Transportation Needs: Not on file  Physical Activity: Not on file  Stress: Not on file  Social Connections: Unknown (07/19/2023)   Received from [facility]   Social Network    Social Network: Not on file  Intimate Partner Violence: Unknown (07/19/2023)   Received from [facility]   HITS    Physically Hurt: Not on file    Insult or  Talk Down To: Not on file    Threaten Physical Harm: Not on file    Scream or Curse: Not on file    Outpatient Medications Prior to Visit  Medication Sig Dispense Refill   albuterol  (PROVENTIL  HFA) 108 (90 Base) MCG/ACT inhaler Inhale 2 puffs into the lungs every 4 (four) hours as needed for wheezing. 1 each 3   azelastine (ASTELIN) 0.1 % nasal spray Place into the nose.     azithromycin  (ZITHROMAX ) 250 MG tablet Take 2 tablets x 1 day, then 1 tablet daily for 4 days for sinus infection 6 tablet 0   b complex vitamins tablet Take 1 tablet by mouth daily.     beclomethasone (QVAR) 40 MCG/ACT inhaler Inhale 2 puffs into the lungs 2 (two) times daily.     EPINEPHrine 0.3 mg/0.3 mL IJ SOAJ injection AS DIRECTED INJECTION 30 DAYS     furosemide  (LASIX ) 20 MG tablet Take 1 tablet (20 mg total) by mouth daily as needed. 90 tablet 2   ibuprofen  (ADVIL ) 600 MG tablet Take 1 tablet (600 mg total) by mouth every 8 (eight) hours as needed. 60 tablet 0   ketoconazole  (NIZORAL ) 2 % cream Apply topically 2 (two) times daily as needed for irritation. 15 g 1    Loratadine 10 MG CAPS Take by mouth.     mometasone  (ELOCON ) 0.1 % cream Apply 1 Application topically 2 (two) times daily. 50 g 3   montelukast  (SINGULAIR ) 10 MG tablet Take 1 tablet (10 mg total) by mouth at bedtime. 30 tablet 3   omeprazole -sodium bicarbonate  (ZEGERID ) 40-1100 MG capsule Take 1 capsule by mouth daily before breakfast. 90 capsule 3   ondansetron  (ZOFRAN ) 4 MG tablet Take 1 tablet (4 mg total) by mouth every 6 (six) hours. 12 tablet 0   Pseudoephedrine-APAP 30-325 MG TABS Take by mouth.     sodium chloride  (OCEAN) 0.65 % nasal spray Place into the nose.     terbinafine  (LAMISIL ) 250 MG tablet Take 1 tablet (250 mg total) by mouth daily. 30 tablet 2   triamcinolone  (NASACORT ) 55 MCG/ACT AERO nasal inhaler Place 2 sprays into the nose daily as needed.     valACYclovir  (VALTREX ) 500 MG tablet Take 1 tablet (500 mg total) by mouth 2 (two) times daily. 14 tablet 3   vitamin C (ASCORBIC ACID) 500 MG tablet Take 500 mg by mouth daily.     Vitamin D , Ergocalciferol , (DRISDOL ) 1.25 MG (50000 UNIT) CAPS capsule Take 1 capsule (50,000 Units total) by mouth every 7 (seven) days. 4 capsule 5   No facility-administered medications prior to visit.    No Known Allergies  Review of Systems  Constitutional:  Negative for chills and fever.  Eyes:  Negative for visual disturbance.  Respiratory:  Negative for chest tightness and shortness of breath.   Musculoskeletal:        Left ankle swelling  Neurological:  Negative for dizziness and headaches.       Objective:    Physical Exam HENT:     Head: Normocephalic.     Mouth/Throat:     Mouth: Mucous membranes are moist.  Cardiovascular:     Rate and Rhythm: Normal rate.     Heart sounds: Normal heart sounds.  Pulmonary:     Effort: Pulmonary effort is normal.     Breath sounds: Normal breath sounds.  Musculoskeletal:     Left ankle: Swelling present. No deformity or ecchymosis. Tenderness (slight) present. Normal range of motion.  Normal pulse.  Neurological:     Mental Status: She is alert.     BP (!) 143/80   Pulse 85   Ht 5' 6 (1.676 m)   Wt 157 lb (71.2 kg)   LMP 08/01/2016 (Approximate)   SpO2 98%   BMI 25.34 kg/m  Wt Readings from Last 3 Encounters:  09/02/24 157 lb (71.2 kg)  04/16/23 227 lb (103 kg)  01/24/20 228 lb 3.2 oz (103.5 kg)       Assessment & Plan:  Chronic pain of left ankle Assessment & Plan: Left Ankle Pain and Swelling -Encouraged to please stop by [facility] to get a X-ray of the left ankle to assess for degenerative changes, soft tissue injury, or other abnormalities. -Recommend ibuprofen  600 mg every 6 hours as needed for pain and inflammation (take with food). -You may also use acetaminophen  [phone] mg every 6 hours as needed (do not exceed 4,000 mg/day).  Nonpharmacological Interventions: -Apply ice for 15-20 minutes several times a day to reduce swelling. -Elevate the ankle above heart level when resting. -Avoid prolonged standing or activities that aggravate pain. -Use ankle support or compression wrap for stability and comfort. -Encourage gentle range-of-motion exercises as tolerated to maintain mobility.  Follow-Up: -Follow up in 2-4 weeks or sooner if pain or swelling worsens. -If imaging confirms arthritis or soft tissue injury, will  consider referral to orthopedics or physical therapy for further management. -Monitor for increased swelling, redness, warmth, or fever, which may indicate infection or worsening inflammation. -Maintain a balanced diet and stay hydrated to support healing.  Orders: -     DG Ankle Complete Left  Tinnitus aurium, left Assessment & Plan: The patient reports experiencing a knocking sound in the left ear, occurring off and on for approximately two months. She notes that prior to the onset of these symptoms, she had episodes of dizziness and imbalance. She denies hearing loss, ear drainage, or pain. -Examination of the ear was  unremarkable. The patient is currently under the care of ENT in [facility] and is encouraged to follow up for further evaluation and management.  Patient Education: -Monitor for the following and report if symptoms occur: -Worsening or persistent ringing or knocking in the ear -Hearing loss or changes in hearing -Dizziness or balance problems -Ear pain, drainage, or pressure    Orders: -     BMP8+EGFR -     Vitamin B12 -     CBC with Differential/Platelet  Note: This chart has been completed using Engineer, civil (consulting) software, and while attempts have been made to ensure accuracy, certain words and phrases may not be transcribed as intended.    Azara Gemme  Z Bacchus, FNP

## 2024-09-15 LAB — BMP8+EGFR
BUN/Creatinine Ratio: 13 (ref 12–28)
BUN: 12 mg/dL (ref 8–27)
CO2: 25 mmol/L (ref 20–29)
Calcium: 9.7 mg/dL (ref 8.7–10.3)
Chloride: 102 mmol/L (ref 96–106)
Creatinine, Ser: 0.95 mg/dL (ref 0.57–1.00)
Glucose: 94 mg/dL (ref 70–99)
Potassium: 4.8 mmol/L (ref 3.5–5.2)
Sodium: 140 mmol/L (ref 134–144)
eGFR: 69 mL/min/1.73 (ref >59)

## 2024-09-15 LAB — CBC WITH DIFFERENTIAL/PLATELET
Basophils Absolute: 0.1 x10E3/uL (ref 0.0–0.2)
Basos: 1 %
EOS (ABSOLUTE): 0.2 x10E3/uL (ref 0.0–0.4)
Eos: 3 %
Hematocrit: 42.9 % (ref 34.0–46.6)
Hemoglobin: 13.4 g/dL (ref 11.1–15.9)
Immature Grans (Abs): 0 x10E3/uL (ref 0.0–0.1)
Immature Granulocytes: 0 %
Lymphocytes Absolute: 2.3 x10E3/uL (ref 0.7–3.1)
Lymphs: 39 %
MCH: 28.7 pg (ref 26.6–33.0)
MCHC: 31.2 g/dL — ABNORMAL LOW (ref 31.5–35.7)
MCV: 92 fL (ref 79–97)
Monocytes Absolute: 0.4 x10E3/uL (ref 0.1–0.9)
Monocytes: 7 %
Neutrophils Absolute: 3 x10E3/uL (ref 1.4–7.0)
Neutrophils: 50 %
Platelets: 283 x10E3/uL (ref 150–450)
RBC: 4.67 x10E6/uL (ref 3.77–5.28)
RDW: 11.8 % (ref 11.7–15.4)
WBC: 5.9 x10E3/uL (ref 3.4–10.8)

## 2024-09-15 LAB — VITAMIN B12: Vitamin B-12: 1190 pg/mL (ref 232–1245)

## 2024-09-27 ENCOUNTER — Ambulatory Visit: Admitting: Student in an Organized Health Care Education/Training Program

## 2024-09-27 ENCOUNTER — Encounter: Payer: Self-pay | Admitting: Student in an Organized Health Care Education/Training Program

## 2024-09-27 ENCOUNTER — Telehealth: Payer: Self-pay

## 2024-09-27 VITALS — BP 123/80 | HR 73 | Wt 213.0 lb

## 2024-09-27 DIAGNOSIS — R051 Acute cough: Secondary | ICD-10-CM

## 2024-09-27 DIAGNOSIS — J0111 Acute recurrent frontal sinusitis: Secondary | ICD-10-CM

## 2024-09-27 LAB — POC COVID19 BINAXNOW: SARS Coronavirus 2 Ag: NEGATIVE

## 2024-09-27 LAB — POCT INFLUENZA A/B
Influenza A, POC: NEGATIVE
Influenza B, POC: NEGATIVE

## 2024-09-27 MED ORDER — FLUCONAZOLE 150 MG PO TABS: 150.0000 mg | ORAL_TABLET | ORAL | 0 refills | Status: AC

## 2024-09-27 MED ORDER — AMOXICILLIN-POT CLAVULANATE 875-125 MG PO TABS: 1.0000 | ORAL_TABLET | Freq: Two times a day (BID) | ORAL | 0 refills | Status: AC

## 2024-09-27 NOTE — Telephone Encounter (Signed)
 Copied from CRM #[id number]. Topic: Clinical - Lab/Test Results >> Sep 27, 2024  8:26 AM Myrick T wrote: Reason for CRM: patient called to get her lab results/advised they had not been read by the provider. Please f/u with patient

## 2024-09-27 NOTE — Assessment & Plan Note (Addendum)
 Recurrent issue with sinusitis for many years.  Current symptoms seem most consistent with a viral etiology.  No signs of otitis media or pneumonia.  Negative point-of-care tests for influenza and COVID-19 today.  We talked about other possible causes include parainfluenza, adenovirus, or rhinovirus.  I recommended against antibiotics for her current symptoms.  Patient is worried about symptom progression, is accustomed to using antibiotics for sinus infections in the past.  I decided to prescribe Augmentin  for her to use only if symptoms worsen, if she were to develop more sinus discomfort, or worsening systemic symptoms like fever.  Patient is reasonable and understands the risks of side effects to antibiotics like antibiotic associated diarrhea or C. difficile infections.  She is doing well with Augmentin  in the past.  She does have common side effect of vulvovaginal candidiasis and requested a Co. prescription of Diflucan  which I think is reasonable. She should return if symptoms worsen.  I recommended continuing local sinus treatment with sinus irrigation, Astelin spray, Nasacort , and Singulair .  No signs of asthma exacerbation, no need for systemic steroids.

## 2024-09-27 NOTE — Progress Notes (Signed)
 Please inform the patient that her labs are stable

## 2024-09-27 NOTE — Patient Instructions (Signed)
  VISIT SUMMARY: During your visit, we discussed your worsening sinus symptoms and overall feeling of being unwell. We reviewed your chronic sinus issues, asthma, and recent symptoms including congestion, throat discomfort, and ear pain.  YOUR PLAN: -VIRAL UPPER RESPIRATORY INFECTION: You have a viral upper respiratory infection, which is an infection of the upper respiratory tract caused by a virus. Your tests for influenza and COVID-19 were negative. Possible causes include parainfluenza, adenovirus, or rhinovirus. Antibiotics are not needed at this time, but Augmentin  has been prescribed to the pharmacy for use if your symptoms worsen, especially if you develop sinus discomfort or fever. Monitor your symptoms for 1-2 days. Diflucan  is also prescribed for use if needed. Please return if your symptoms do not improve in a few days or worsen.  -CHRONIC ALLERGIC RHINITIS AND SINUSITIS: You have chronic allergic rhinitis and sinusitis, which are long-term conditions causing inflammation of the nasal passages and sinuses. Your current management includes using Astelin spray, Nasacort , Singulair , and occasional sinus rinses. Continue using Astelin spray, Nasacort , and Singulair . Consider performing sinus rinses if your symptoms persist.  -ASTHMA: Your asthma is a condition that causes your airways to become inflamed and narrow, making it difficult to breathe. Your asthma is well-controlled with no recent exacerbations. Continue using your Qvar inhaler and albuterol  as needed. There is no current need for steroids.  INSTRUCTIONS: Monitor your symptoms for 1-2 days. If your symptoms worsen or you develop sinus discomfort or fever, use the prescribed Augmentin . If needed, use the prescribed Diflucan . Return if your symptoms do not improve in a few days or worsen.

## 2024-09-27 NOTE — Progress Notes (Signed)
 Acute Office Visit  Patient ID: Angie Powell, female    DOB: 02-03-1966, 58 y.o.   MRN: [id number]  PCP: Terry Wilhelmena Lloyd Hilario, FNP  Chief Complaint  Patient presents with   Cough    Congestion, headache off and on. Throat and ears hurting this weekend. Drainage.     Subjective:     HPI  Discussed the use of AI scribe software for clinical note transcription with the patient, who gave verbal consent to proceed.  History of Present Illness Angie Powell is a 58 year old female with chronic sinus issues and asthma who presents with worsening sinus symptoms.  She has been experiencing chronic sinus issues, which have been persistent and troublesome. She was treated with an antibiotic, Septimere, in September 2025 at the [facility], but did not feel significant improvement. Her symptoms worsened over the past weekend, with increased congestion, throat discomfort, and ear pain.  She describes feeling generally unwell since Saturday morning, noting increased congestion and the need to clear her throat. No body aches, but she mentions feeling 'sick all over.' She reports ear pain in both ears and a developing sore throat as of this morning. No shortness of breath, chest pain, or cough.  Her past medical history includes asthma, for which she uses Qvar and albuterol  as needed, although she has not needed albuterol  recently. For her sinus issues, she uses Astelin spray, Nasacort , and Singulair . She occasionally performs sinus rinses but not regularly.  She also has a history of acid reflux. She has not had any recent changes in her medications.      Objective:    BP 123/80   Pulse 73   Wt 213 lb (96.6 kg)   LMP 08/01/2016 (Approximate)   SpO2 100%   BMI 34.38 kg/m   Physical Exam  Gen: Well-appearing woman Ears: Right tympanic membrane is normal, left tympanic membrane is more erythematous with a small posterior effusion, normal light reflex Mouth: Normal  posterior oropharynx Neck: No tender adenopathy Lungs: Unlabored, clear throughout with no crackles or wheezing  Results for orders placed or performed in visit on 09/27/24  POC COVID-19  Result Value Ref Range   SARS Coronavirus 2 Ag Negative Negative  POCT Influenza A/B  Result Value Ref Range   Influenza A, POC Negative Negative   Influenza B, POC Negative Negative       Assessment & Plan:   Problem List Items Addressed This Visit       Unprioritized   Acute sinusitis - Primary   Recurrent issue with sinusitis for many years.  Current symptoms seem most consistent with a viral etiology.  No signs of otitis media or pneumonia.  Negative point-of-care tests for influenza and COVID-19 today.  We talked about other possible causes include parainfluenza, adenovirus, or rhinovirus.  I recommended against antibiotics for her current symptoms.  Patient is worried about symptom progression, is accustomed to using antibiotics for sinus infections in the past.  I decided to prescribe Augmentin  for her to use only if symptoms worsen, if she were to develop more sinus discomfort, or worsening systemic symptoms like fever.  Patient is reasonable and understands the risks of side effects to antibiotics like antibiotic associated diarrhea or C. difficile infections.  She is doing well with Augmentin  in the past.  She does have common side effect of vulvovaginal candidiasis and requested a Co. prescription of Diflucan  which I think is reasonable. She should return if symptoms worsen.  I recommended continuing local sinus treatment with sinus irrigation, Astelin spray, Nasacort , and Singulair .  No signs of asthma exacerbation, no need for systemic steroids.      Relevant Medications   amoxicillin -clavulanate (AUGMENTIN ) 875-125 MG tablet   fluconazole  (DIFLUCAN ) 150 MG tablet   Other Visit Diagnoses       Acute cough       Relevant Orders   POC COVID-19 (Completed)   POCT Influenza A/B (Completed)         Meds ordered this encounter  Medications   amoxicillin -clavulanate (AUGMENTIN ) 875-125 MG tablet    Sig: Take 1 tablet by mouth 2 (two) times daily for 7 days.    Dispense:  14 tablet    Refill:  0   fluconazole  (DIFLUCAN ) 150 MG tablet    Sig: Take 1 tablet (150 mg total) by mouth every other day for 2 doses.    Dispense:  2 tablet    Refill:  0    Return if symptoms worsen or fail to improve.  Cleatus Debby Specking, MD [facility]

## 2024-12-03 ENCOUNTER — Other Ambulatory Visit: Payer: Self-pay | Admitting: Family Medicine
